# Patient Record
Sex: Male | Born: 1975 | Race: White | Hispanic: No | Marital: Married | Smoking: Never smoker
Health system: Southern US, Community
[De-identification: ages and names within clinical notes are randomized; demographics above are authoritative.]

## PROBLEM LIST (undated history)

## (undated) DIAGNOSIS — E7800 Pure hypercholesterolemia, unspecified: Secondary | ICD-10-CM

## (undated) DIAGNOSIS — I10 Essential (primary) hypertension: Secondary | ICD-10-CM

## (undated) DIAGNOSIS — E119 Type 2 diabetes mellitus without complications: Secondary | ICD-10-CM

## (undated) HISTORY — PX: HERNIA REPAIR: SHX51

---

## 2013-08-20 ENCOUNTER — Encounter (HOSPITAL_COMMUNITY): Payer: Self-pay | Admitting: Emergency Medicine

## 2013-08-20 ENCOUNTER — Emergency Department (HOSPITAL_COMMUNITY)
Admission: EM | Admit: 2013-08-20 | Discharge: 2013-08-20 | Disposition: A | Discharge status: Home / Self Care | Payer: BC Managed Care – PPO | Attending: Emergency Medicine | Admitting: Emergency Medicine

## 2013-08-20 ENCOUNTER — Emergency Department (HOSPITAL_COMMUNITY): Payer: BC Managed Care – PPO

## 2013-08-20 DIAGNOSIS — Z79899 Other long term (current) drug therapy: Secondary | ICD-10-CM | POA: Insufficient documentation

## 2013-08-20 DIAGNOSIS — E785 Hyperlipidemia, unspecified: Secondary | ICD-10-CM | POA: Insufficient documentation

## 2013-08-20 DIAGNOSIS — M47812 Spondylosis without myelopathy or radiculopathy, cervical region: Secondary | ICD-10-CM | POA: Insufficient documentation

## 2013-08-20 DIAGNOSIS — I10 Essential (primary) hypertension: Secondary | ICD-10-CM | POA: Insufficient documentation

## 2013-08-20 DIAGNOSIS — M25519 Pain in unspecified shoulder: Secondary | ICD-10-CM

## 2013-08-20 HISTORY — DX: Essential (primary) hypertension: I10

## 2013-08-20 HISTORY — DX: Pure hypercholesterolemia, unspecified: E78.00

## 2013-08-20 MED ORDER — CELECOXIB 100 MG PO CAPS: 100.0000 mg | ORAL_CAPSULE | Freq: Two times a day (BID) | ORAL | Status: DC

## 2013-08-20 MED ORDER — DEXAMETHASONE SODIUM PHOSPHATE 4 MG/ML IJ SOLN
8.0000 mg | Freq: Once | INTRAMUSCULAR | Status: AC
  Administered 2013-08-20: 8 mg via INTRAMUSCULAR
  Filled 2013-08-20: qty 2

## 2013-08-20 MED ORDER — HYDROCODONE-ACETAMINOPHEN 5-325 MG PO TABS: 1.0000 | ORAL_TABLET | ORAL | Status: DC | PRN

## 2013-08-20 MED ORDER — HYDROCODONE-ACETAMINOPHEN 5-325 MG PO TABS
2.0000 | ORAL_TABLET | Freq: Once | ORAL | Status: AC
  Administered 2013-08-20: 2 via ORAL
  Filled 2013-08-20: qty 2

## 2013-08-20 MED ORDER — KETOROLAC TROMETHAMINE 10 MG PO TABS
10.0000 mg | ORAL_TABLET | Freq: Once | ORAL | Status: AC
  Administered 2013-08-20: 10 mg via ORAL
  Filled 2013-08-20: qty 1
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
## 2013-08-20 NOTE — ED Notes (Signed)
Pt c/o neck pain that radiates down right arm x 2 weeks

## 2013-08-20 NOTE — ED Notes (Signed)
C/O lower cervical spine pain x > 1 month. Was seen by MD and given Carisprodol which has not helped.  Pain is sharp w/occasional tingling and radiates under scapula, to right shoulder and down R arm to elbow.  Muscle strength 3+ on R bicep, tricep.  States it hurts for him to raise his head upward.  Denies HAs, CP, dizziness, N/V.

## 2013-08-20 NOTE — Discharge Instructions (Signed)
Your x-rays are negative for fracture or dislocation. Your cervical spine films reveal some degenerative changes present. Please use Celebrex 2 times daily, Norco every 4 hours if needed for pain. This medication may cause drowsiness, please use with caution. Please see the orthopedic specialist listed above, or the orthopedist of your choice for additional evaluation and to complete the workup. Shoulder Pain The shoulder is the joint that connects your arms to your body. The bones that form the shoulder joint include the upper arm bone (humerus), the shoulder blade (scapula), and the collarbone (clavicle). The top of the humerus is shaped like a ball and fits into a rather flat socket on the scapula (glenoid cavity). A combination of muscles and strong, fibrous tissues that connect muscles to bones (tendons) support your shoulder joint and hold the ball in the socket. Small, fluid-filled sacs (bursae) are located in different areas of the joint. They act as cushions between the bones and the overlying soft tissues and help reduce friction between the gliding tendons and the bone as you move your arm. Your shoulder joint allows a wide range of motion in your arm. This range of motion allows you to do things like scratch your back or throw a ball. However, this range of motion also makes your shoulder more prone to pain from overuse and injury. Causes of shoulder pain can originate from both injury and overuse and usually can be grouped in the following four categories:  Redness, swelling, and pain (inflammation) of the tendon (tendinitis) or the bursae (bursitis).  Instability, such as a dislocation of the joint.  Inflammation of the joint (arthritis).  Broken bone (fracture). HOME CARE INSTRUCTIONS   Apply ice to the sore area.  Put ice in a plastic bag.  Place a towel between your skin and the bag.  Leave the ice on for 15-20 minutes, 03-04 times per day for the first 2 days.  Stop using cold  packs if they do not help with the pain.  If you have a shoulder sling or immobilizer, wear it as long as your caregiver instructs. Only remove it to shower or bathe. Move your arm as little as possible, but keep your hand moving to prevent swelling.  Squeeze a soft ball or foam pad as much as possible to help prevent swelling.  Only take over-the-counter or prescription medicines for pain, discomfort, or fever as directed by your caregiver. SEEK MEDICAL CARE IF:   Your shoulder pain increases, or new pain develops in your arm, hand, or fingers.  Your hand or fingers become cold and numb.  Your pain is not relieved with medicines. SEEK IMMEDIATE MEDICAL CARE IF:   Your arm, hand, or fingers are numb or tingling.  Your arm, hand, or fingers are significantly swollen or turn white or blue. MAKE SURE YOU:   Understand these instructions.  Will watch your condition.  Will get help right away if you are not doing well or get worse. Document Released: 12/04/2004 Document Revised: 11/19/2011 Document Reviewed: 02/08/2011 [facility] Patient Information 2014 [place].

## 2013-08-20 NOTE — ED Provider Notes (Signed)
Medical screening examination/treatment/procedure(s) were performed by non-physician practitioner and as supervising physician I was immediately available for consultation/collaboration.   EKG Interpretation None       Orlie Dakin, MD 08/20/13 1614

## 2013-08-20 NOTE — ED Notes (Signed)
Patient with no complaints at this time. Respirations even and unlabored. Skin warm/dry. Discharge instructions reviewed with patient at this time. Patient given opportunity to voice concerns/ask questions. Patient discharged at this time and left Emergency Department with steady gait.   

## 2013-09-01 ENCOUNTER — Telehealth: Payer: Self-pay | Admitting: Orthopedic Surgery

## 2013-09-01 NOTE — Telephone Encounter (Signed)
Patient called to cancel appointment and stated he was seen by another Orthopedic DR.

## 2013-09-06 ENCOUNTER — Ambulatory Visit: Payer: BC Managed Care – PPO | Admitting: Orthopedic Surgery

## 2015-05-29 IMAGING — CR DG CERVICAL SPINE COMPLETE 4+V
7 series · 7 of 7 positions shown · non-contrast
Comparison: None.

CLINICAL DATA: Right shoulder neck pain for 1 month. No history of
recent injury.

EXAM:
CERVICAL SPINE  4+ VIEWS

[view not recorded (1 of 7)]
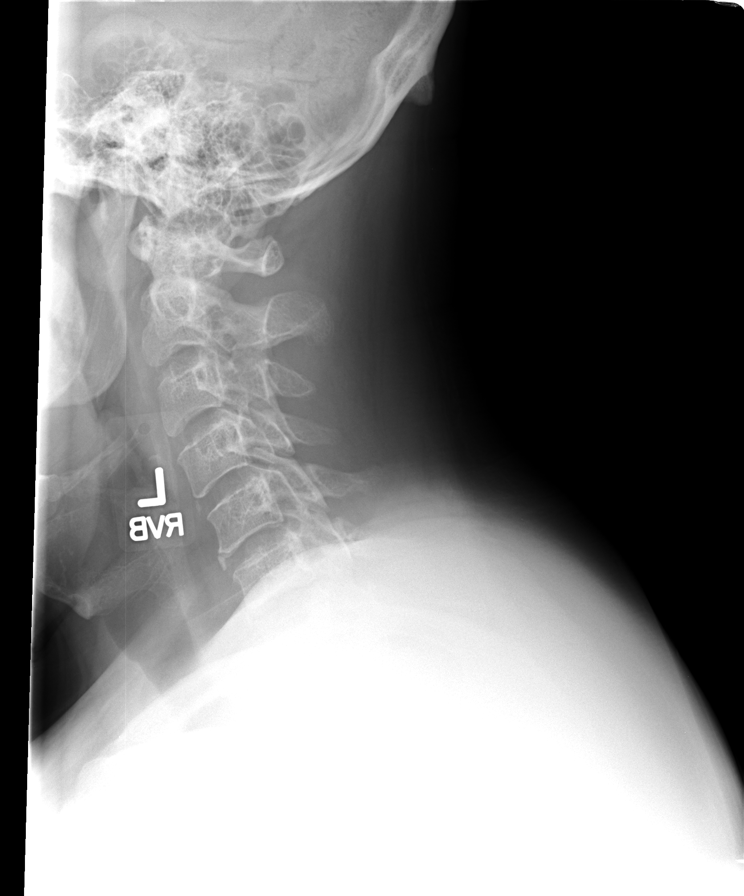

[view not recorded (2 of 7)]
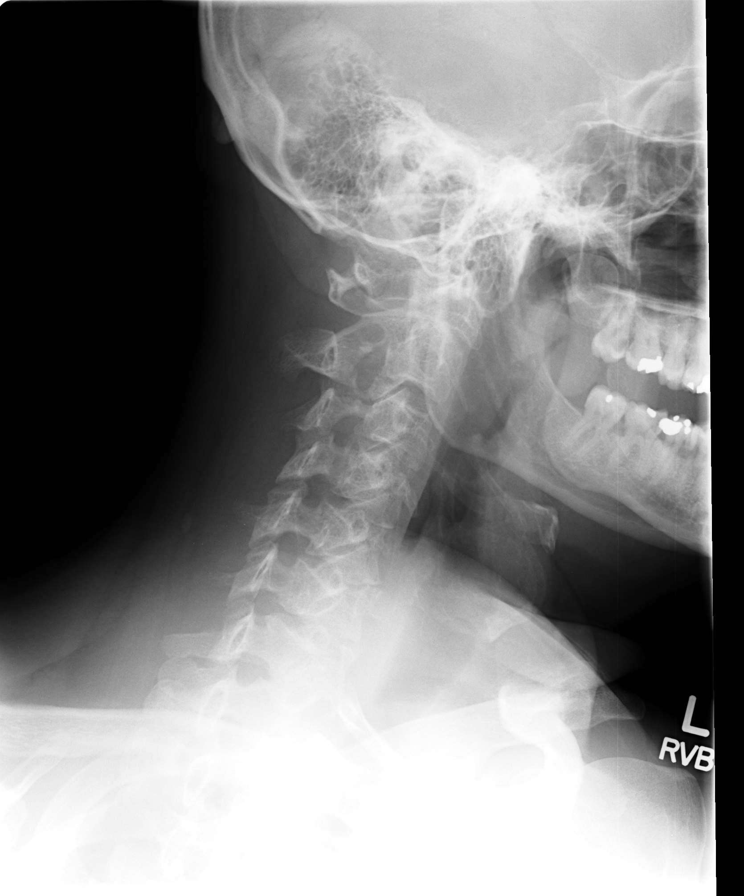

[view not recorded (3 of 7)]
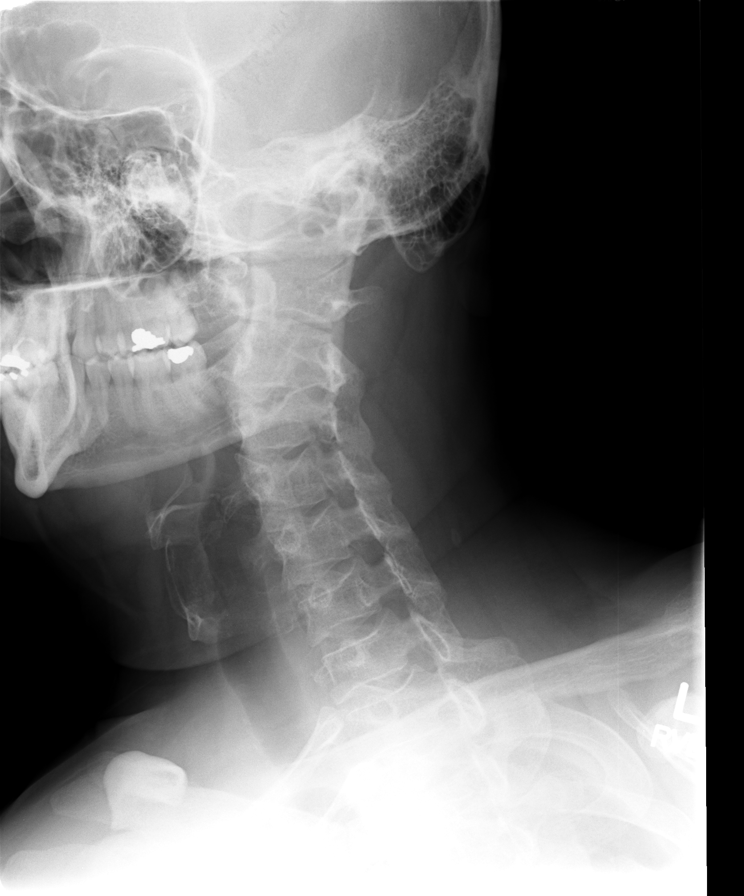

[view not recorded (4 of 7)]
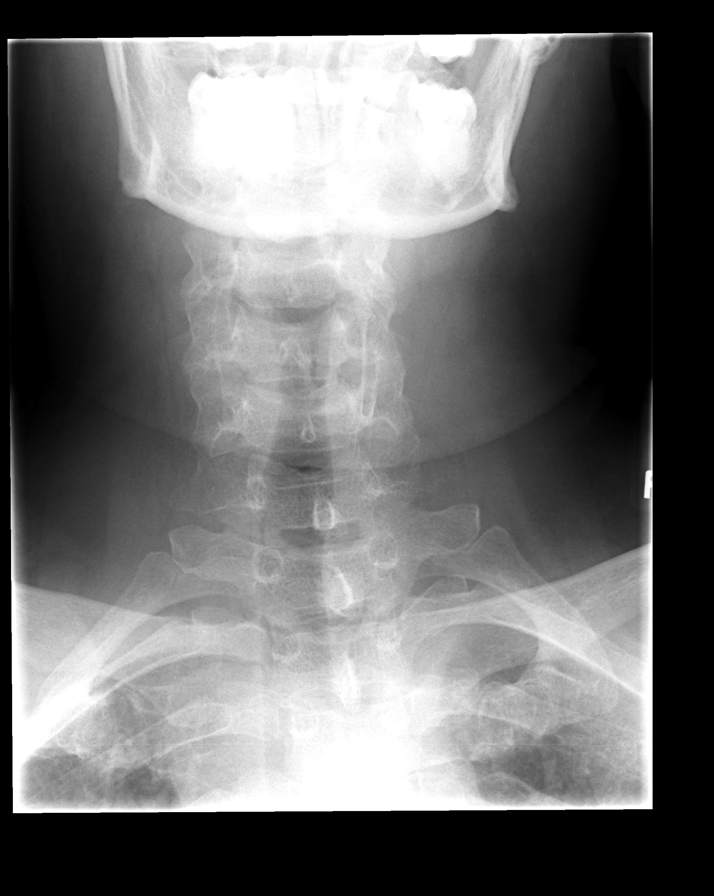

[view not recorded (5 of 7)]
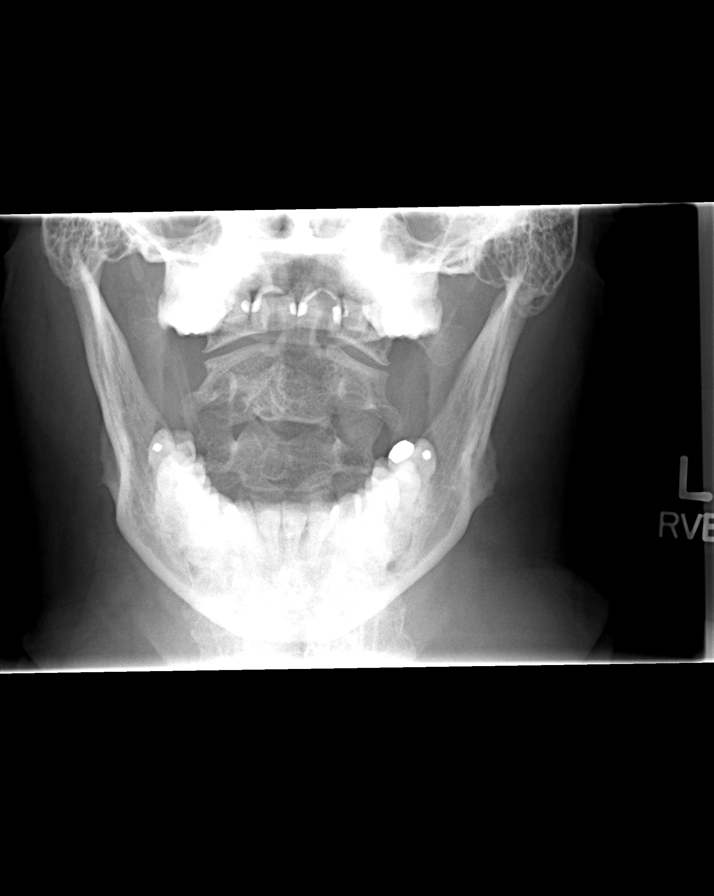

[view not recorded (6 of 7)]
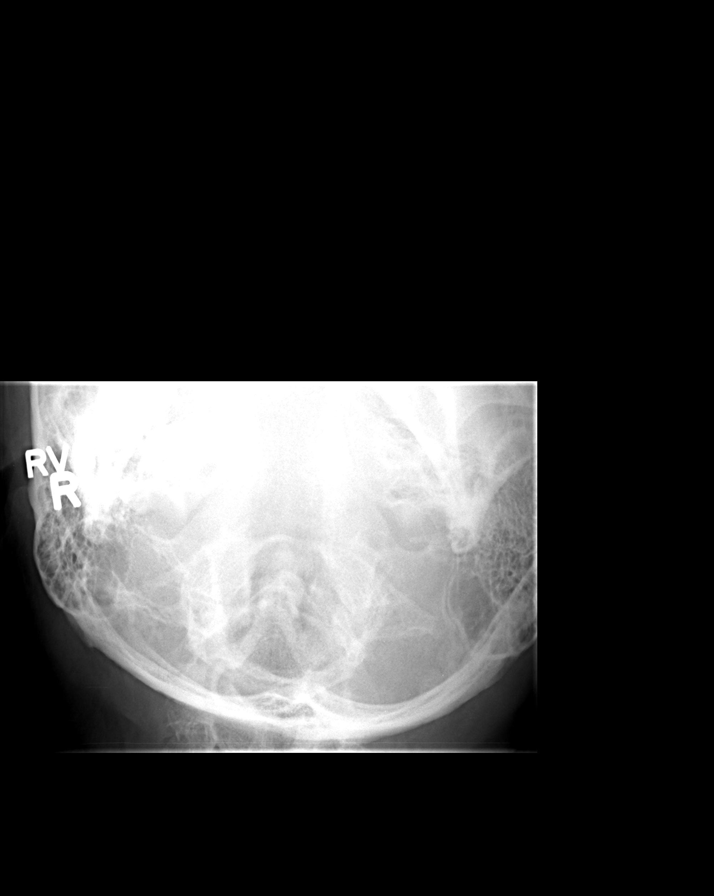

[view not recorded (7 of 7)]
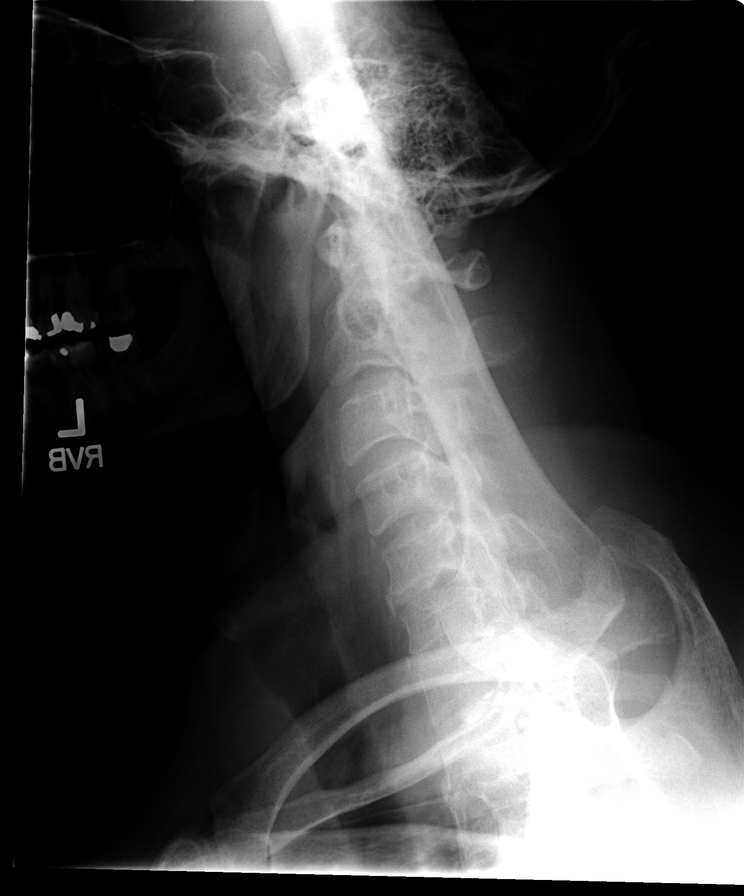

[7 of 7 positions shown; findings below may reference images not displayed]

FINDINGS: There is no evidence of cervical spine fracture or prevertebral soft
tissue swelling. No subluxation. There is mild spondylotic
ossification in the anterior mid and lower cervical region, without
significant disc narrowing. No osseous foraminal stenosis to explain
shoulder pain.
IMPRESSION: No acute osseous abnormality or notable degenerative change.

## 2017-05-19 ENCOUNTER — Encounter: Payer: Self-pay | Admitting: Orthopedic Surgery

## 2017-05-19 ENCOUNTER — Ambulatory Visit: Payer: BC Managed Care – PPO | Admitting: Orthopedic Surgery

## 2017-05-19 ENCOUNTER — Ambulatory Visit (INDEPENDENT_AMBULATORY_CARE_PROVIDER_SITE_OTHER): Payer: BC Managed Care – PPO

## 2017-05-19 VITALS — BP 129/87 | HR 92 | Ht 67.0 in | Wt 239.0 lb

## 2017-05-19 DIAGNOSIS — M542 Cervicalgia: Secondary | ICD-10-CM | POA: Diagnosis not present

## 2017-05-19 DIAGNOSIS — M75101 Unspecified rotator cuff tear or rupture of right shoulder, not specified as traumatic: Secondary | ICD-10-CM

## 2017-05-19 NOTE — Progress Notes (Signed)
  NEW PATIENT OFFICE VISIT   Chief Complaint  Patient presents with  . Neck Pain    Neck pain, no injury    42 year old male presents for evaluation of neck and shoulder pain  He gives a history of prior shoulder neck pain relieved with subacromial injection.  On this occasion he was doing a lot of repetitive lifting removing some laminate flooring and started having pain in the neck radiating down into the shoulder up to the elbow and not below  He says it feels very similar  His current symptoms are dull aching shoulder and neck pain right shoulder midline cervical spine radiates to the elbow present for 2-3 weeks associated with no hand symptoms and no loss of motion in the shoulder.    Review of Systems  Constitutional: Negative for chills, fever, malaise/fatigue and weight loss.  Musculoskeletal: Positive for joint pain and neck pain.  Neurological: Negative for tingling, sensory change and focal weakness.     Past Medical History:  Diagnosis Date  . Hypercholesteremia   . Hypertension     No past surgical history on file.  No family history on file. Social History   Tobacco Use  . Smoking status: Never Smoker  Substance Use Topics  . Alcohol use: No  . Drug use: No   No Known Allergies   No outpatient medications have been marked as taking for the 05/19/17 encounter (Office Visit) with Carole Civil, MD.    BP 129/87   Pulse 92   Ht 5\' 7"  (1.702 m)   Wt 239 lb (108.4 kg)   BMI 37.43 kg/m   Physical Exam  Ortho Exam  MEDICAL DECISION SECTION  xrays ordered? yes  My independent reading of xrays: Our x-rays today were done of the neck he has mid cervical spine spondylosis moderate to severe loss of lordosis   Encounter Diagnosis  Name Primary?  . Neck pain without injury Yes     PLAN:    Injection? Yes  Procedure note the subacromial injection shoulder RIGHT  Verbal consent was obtained to inject the  RIGHT   Shoulder  Timeout  was completed to confirm the injection site is a subacromial space of the  RIGHT  shoulder   Medication used Depo-Medrol 40 mg and lidocaine 1% 3 cc  Anesthesia was provided by ethyl chloride  The injection was performed in the RIGHT  posterior subacromial space. After pinning the skin with alcohol and anesthetized the skin with ethyl chloride the subacromial space was injected using a 20-gauge needle. There were no complications  Sterile dressing was applied.   Recommend heating pad Insert over-the-counter ibuprofen or Aleve If this does not take care of it then we should pursue his neck as a source of pain with further imaging

## 2017-05-19 NOTE — Patient Instructions (Signed)
Recommend heating pad Insert over-the-counter ibuprofen or Aleve If there is no improvement after 2 weeks please call us back so that we can reevaluate you with further office visit and CT or MRI

## 2018-04-28 ENCOUNTER — Ambulatory Visit: Payer: BC Managed Care – PPO | Admitting: Urology

## 2018-04-28 DIAGNOSIS — E291 Testicular hypofunction: Secondary | ICD-10-CM | POA: Diagnosis not present

## 2018-07-12 ENCOUNTER — Encounter (HOSPITAL_COMMUNITY): Payer: Self-pay | Admitting: Emergency Medicine

## 2018-07-12 ENCOUNTER — Other Ambulatory Visit: Payer: Self-pay

## 2018-07-12 ENCOUNTER — Emergency Department (HOSPITAL_COMMUNITY)
Admission: EM | Admit: 2018-07-12 | Discharge: 2018-07-12 | Disposition: A | Discharge status: Home / Self Care | Payer: BC Managed Care – PPO | Attending: Emergency Medicine | Admitting: Emergency Medicine

## 2018-07-12 DIAGNOSIS — Y999 Unspecified external cause status: Secondary | ICD-10-CM | POA: Insufficient documentation

## 2018-07-12 DIAGNOSIS — Z23 Encounter for immunization: Secondary | ICD-10-CM | POA: Diagnosis not present

## 2018-07-12 DIAGNOSIS — Y9285 Railroad track as the place of occurrence of the external cause: Secondary | ICD-10-CM | POA: Insufficient documentation

## 2018-07-12 DIAGNOSIS — Z79899 Other long term (current) drug therapy: Secondary | ICD-10-CM | POA: Insufficient documentation

## 2018-07-12 DIAGNOSIS — Y9389 Activity, other specified: Secondary | ICD-10-CM | POA: Insufficient documentation

## 2018-07-12 DIAGNOSIS — I10 Essential (primary) hypertension: Secondary | ICD-10-CM | POA: Insufficient documentation

## 2018-07-12 DIAGNOSIS — S71151A Open bite, right thigh, initial encounter: Secondary | ICD-10-CM | POA: Diagnosis not present

## 2018-07-12 DIAGNOSIS — W5911XA Bitten by nonvenomous snake, initial encounter: Secondary | ICD-10-CM | POA: Insufficient documentation

## 2018-07-12 MED ORDER — TETANUS-DIPHTH-ACELL PERTUSSIS 5-2.5-18.5 LF-MCG/0.5 IM SUSP
0.5000 mL | Freq: Once | INTRAMUSCULAR | Status: AC
  Administered 2018-07-12: 0.5 mL via INTRAMUSCULAR
  Filled 2018-07-12: qty 0.5

## 2018-07-12 MED ORDER — OXYCODONE-ACETAMINOPHEN 5-325 MG PO TABS
1.0000 | ORAL_TABLET | Freq: Once | ORAL | Status: AC
  Administered 2018-07-12: 1 via ORAL
  Filled 2018-07-12: qty 1

## 2018-07-12 NOTE — ED Provider Notes (Signed)
[facility] EMERGENCY DEPARTMENT Provider Note   CSN: [id number] Arrival date & time: 07/12/18  1248    History   Chief Complaint Chief Complaint  Patient presents with  . Animal Bite    HPI Derek Hines is a 43 y.o. male.     HPI   Derek Hines is a 43 y.o. male who presents to the Emergency Department complaining of snake bite to right thigh.  Incident occurred 30 minutes prior to arrival.  States that he stepped over two railroad ties and a black snake was lying between them and bit him on the leg.  No prior therapies, complains of a burning sensation at the site.  Denies swelling or numbness of the extremity, redness, nausea or vomiting.  Last Td is unknown.   Past Medical History:  Diagnosis Date  . Hypercholesteremia   . Hypertension     There are no active problems to display for this patient.   History reviewed. No pertinent surgical history.      Home Medications    Prior to Admission medications   Medication Sig Start Date End Date Taking? Authorizing Provider  carisoprodol (SOMA) 350 MG tablet Take 350 mg by mouth 3 (three) times daily as needed for muscle spasms.    [provider]  celecoxib (CELEBREX) 100 MG capsule Take 1 capsule (100 mg total) by mouth 2 (two) times daily. 08/20/13   Lily Kocher, PA-C  Fexofenadine HCl (ALLEGRA PO) Take 1 tablet by mouth daily.    [provider]  HYDROcodone-acetaminophen (NORCO/VICODIN) 5-325 MG per tablet Take 1 tablet by mouth every 4 (four) hours as needed for moderate pain. 08/20/13   Lily Kocher, PA-C  ibuprofen (ADVIL,MOTRIN) 200 MG tablet Take 800 mg by mouth every 6 (six) hours as needed for mild pain.    [provider]  lisinopril (PRINIVIL,ZESTRIL) 40 MG tablet Take 40 mg by mouth daily.    [provider]  pravastatin (PRAVACHOL) 10 MG tablet Take 10 mg by mouth at bedtime.    [provider]  triamcinolone (NASACORT ALLERGY 24HR) 55 MCG/ACT AERO  nasal inhaler Place 1 spray into the nose daily.    [provider]    Family History History reviewed. No pertinent family history.  Social History Social History   Tobacco Use  . Smoking status: Never Smoker  . Smokeless tobacco: Never Used  Substance Use Topics  . Alcohol use: No  . Drug use: No     Allergies   Patient has no known allergies.   Review of Systems Review of Systems  Constitutional: Negative for chills and fever.  Musculoskeletal: Positive for myalgias. Negative for arthralgias and joint swelling.  Skin: Positive for wound.       Snake bite right thigh  Neurological: Negative for dizziness, weakness and numbness.  Hematological: Does not bruise/bleed easily.     Physical Exam Updated Vital Signs BP (!) 141/121   Pulse (!) 108   Temp 98.1 F (36.7 C)   Resp 13   Ht 5\' 6"  (1.676 m)   Wt 107 kg   SpO2 100%   BMI 38.09 kg/m   Physical Exam Vitals signs and nursing note reviewed.  Constitutional:      General: He is not in acute distress.    Appearance: Normal appearance. He is well-developed.  HENT:     Head: Atraumatic.  Cardiovascular:     Rate and Rhythm: Normal rate and regular rhythm.     Pulses: Normal pulses.  Heart sounds: No murmur.  Pulmonary:     Effort: Pulmonary effort is normal. No respiratory distress.     Breath sounds: Normal breath sounds.  Musculoskeletal: Normal range of motion.        General: Tenderness present. No swelling.  Skin:    General: Skin is warm and dry.     Capillary Refill: Capillary refill takes less than 2 seconds.     Comments: Pin point puncture wounds to the medial right thigh.  No surrounding erythema, edema or induration.    Neurological:     General: No focal deficit present.     Mental Status: He is alert.     Sensory: No sensory deficit.     Motor: No abnormal muscle tone.     Coordination: Coordination normal.      Pt gave verbal consent for digital image to be placed in  medical record.  Understands that no imagines will be saved to any portable electronic devices.    ED Treatments / Results  Labs (all labs ordered are listed, but only abnormal results are displayed) Labs Reviewed - No data to display  EKG None  Radiology No results found.  Procedures Procedures (including critical care time)  Medications Ordered in ED Medications - No data to display   Initial Impression / Assessment and Plan / ED Course  I have reviewed the triage vital signs and the nursing notes.  Pertinent labs & imaging results that were available during my care of the patient were reviewed by me and considered in my medical decision making (see chart for details).        Pt with reported snake bite to right medial thigh.  No surrounding erythema or edema.  Wound borders marked by nursing upon arrival.  NV intact.  Pt reports bite was by a black snake.  Will clean, up date Td and observe.    1400  On recheck, pt is resting comfortably.  Pain improving.  Wounds cleaned, Td updated.  Remains NV intact, no complications.    1450  Symptoms improved, ambulating w/o pain or difficulty.  NV intact.  No developing erythema or edema.  Pt appears appropriate for d/c home, agrees to close observation and return precautions discussed.   Final Clinical Impressions(s) / ED Diagnoses   Final diagnoses:  Snake bite, initial encounter    ED Discharge Orders    None       Bufford Lope 07/12/18 1456    Virgel Manifold, MD 07/13/18 0700

## 2018-07-12 NOTE — Discharge Instructions (Addendum)
Keep the area clean with mild soap and water.  Watch for surrounding redness, swelling or increasing pain.  Ibuprofen if needed for pain.  Return here for any worsening symptoms

## 2018-07-12 NOTE — ED Triage Notes (Signed)
Pt was bit on the left thigh by a black garden snake.  Burning sensation present. Pt very anxious

## 2018-09-22 ENCOUNTER — Ambulatory Visit: Payer: BC Managed Care – PPO | Admitting: Urology

## 2019-10-13 ENCOUNTER — Ambulatory Visit (INDEPENDENT_AMBULATORY_CARE_PROVIDER_SITE_OTHER): Payer: BC Managed Care – PPO | Admitting: Urology

## 2019-10-13 ENCOUNTER — Other Ambulatory Visit: Payer: Self-pay

## 2019-10-13 ENCOUNTER — Encounter: Payer: Self-pay | Admitting: Urology

## 2019-10-13 VITALS — BP 113/74 | HR 111 | Temp 99.0°F | Ht 66.0 in | Wt 224.0 lb

## 2019-10-13 DIAGNOSIS — E291 Testicular hypofunction: Secondary | ICD-10-CM | POA: Insufficient documentation

## 2019-10-13 LAB — URINALYSIS, ROUTINE W REFLEX MICROSCOPIC
Bilirubin, UA: NEGATIVE
Ketones, UA: NEGATIVE
Leukocytes,UA: NEGATIVE
Nitrite, UA: NEGATIVE
Protein,UA: NEGATIVE
RBC, UA: NEGATIVE
Specific Gravity, UA: 1.02 (ref 1.005–1.030)
Urobilinogen, Ur: 0.2 mg/dL (ref 0.2–1.0)
pH, UA: 5 (ref 5.0–7.5)

## 2019-10-13 NOTE — Progress Notes (Signed)
10/13/2019 9:19 AM   Derek Hines [birth year] [id number]  Referring provider: Renee Rival, NP [address] [place],  [place] [address]  Hypogonadism  HPI: Mr Derek Hines is a 44yo here for followup for hypogonadism. He has not been seen since 04/2018. Testosterone at that time was 306. He continue to have fatigue, fair libido, issues maintaining an erection.    PMH: Past Medical History:  Diagnosis Date  . Hypercholesteremia   . Hypertension     Surgical History: No past surgical history on file.  Home Medications:  Allergies as of 10/13/2019   No Known Allergies     Medication List       Accurate as of October 13, 2019  9:19 AM. If you have any questions, ask your nurse or doctor.        ALLEGRA PO Take 1 tablet by mouth daily.   buPROPion 300 MG 24 hr tablet Commonly known as: WELLBUTRIN XL Take 300 mg by mouth at bedtime.   carisoprodol 350 MG tablet Commonly known as: SOMA Take 350 mg by mouth 3 (three) times daily as needed for muscle spasms.   celecoxib 100 MG capsule Commonly known as: CeleBREX Take 1 capsule (100 mg total) by mouth 2 (two) times daily.   DULoxetine 30 MG capsule Commonly known as: CYMBALTA Take 30 mg by mouth daily.   Fish Oil 1000 MG Caps Take by mouth.   hydrochlorothiazide 12.5 MG tablet Commonly known as: HYDRODIURIL Take 12.5 mg by mouth daily.   HYDROcodone-acetaminophen 5-325 MG tablet Commonly known as: NORCO/VICODIN Take 1 tablet by mouth every 4 (four) hours as needed for moderate pain.   ibuprofen 200 MG tablet Commonly known as: ADVIL Take 800 mg by mouth every 6 (six) hours as needed for mild pain.   lisinopril 40 MG tablet Commonly known as: ZESTRIL Take 40 mg by mouth daily.   metFORMIN 500 MG tablet Commonly known as: GLUCOPHAGE Take by mouth 2 (two) times daily with a meal.   Nasacort Allergy 24HR 55 MCG/ACT Aero nasal inhaler Generic drug: triamcinolone Place 1 spray into the nose daily.     pravastatin 10 MG tablet Commonly known as: PRAVACHOL Take 10 mg by mouth at bedtime.       Allergies: No Known Allergies  Family History: No family history on file.  Social History:  reports that he has never smoked. He has never used smokeless tobacco. He reports that he does not drink alcohol and does not use drugs.  ROS: All other review of systems were reviewed and are negative except what is noted above in HPI  Physical Exam: BP 113/74   Pulse (!) 111   Temp 99 F (37.2 C)   Ht 5\' 6"  (1.676 m)   Wt 224 lb (101.6 kg)   BMI 36.15 kg/m   Constitutional:  Alert and oriented, No acute distress. HEENT: [place] AT, moist mucus membranes.  Trachea midline, no masses. Cardiovascular: No clubbing, cyanosis, or edema. Respiratory: Normal respiratory effort, no increased work of breathing. GI: Abdomen is soft, nontender, nondistended, no abdominal masses GU: No CVA tenderness.  Lymph: No cervical or inguinal lymphadenopathy. Skin: No rashes, bruises or suspicious lesions. Neurologic: Grossly intact, no focal deficits, moving all 4 extremities. Psychiatric: Normal mood and affect.  Laboratory Data: No results found for: WBC, HGB, HCT, MCV, PLT  No results found for: CREATININE  No results found for: PSA  No results found for: TESTOSTERONE  No results found for: HGBA1C  Urinalysis  Component Value Date/Time   APPEARANCEUR Clear 10/13/2019 0858   GLUCOSEU 1+ (A) 10/13/2019 0858   BILIRUBINUR Negative 10/13/2019 0858   PROTEINUR Negative 10/13/2019 0858   NITRITE Negative 10/13/2019 0858   LEUKOCYTESUR Negative 10/13/2019 0858    Lab Results  Component Value Date   LABMICR Comment 10/13/2019    Pertinent Imaging:  No results found for this or any previous visit.  No results found for this or any previous visit.  No results found for this or any previous visit.  No results found for this or any previous visit.  No results found for this or any previous  visit.  No results found for this or any previous visit.  No results found for this or any previous visit.  No results found for this or any previous visit.   Assessment & Plan:    1. Hypogonadism in male -Testosterone labs today -We will start IM testosterone based on labs -RTC 3 months with labs - Urinalysis, Routine w reflex microscopic   No follow-ups on file.  Nicolette Bang, MD  [facility] [place]

## 2019-10-13 NOTE — Progress Notes (Signed)
Urological Symptom Review  Patient is experiencing the following symptoms: Get up at night to urinate Leakage of urine Erection problems (male only)  Kidney stones   Review of Systems  Gastrointestinal (upper)  : Negative for upper GI symptoms  Gastrointestinal (lower) : Constipation  Constitutional : Negative for symptoms  Skin: Negative for skin symptoms  Eyes: Negative for eye symptoms  Ear/Nose/Throat : Negative for Ear/Nose/Throat symptoms  Hematologic/Lymphatic: Negative for Hematologic/Lymphatic symptoms  Cardiovascular : Negative for cardiovascular symptoms  Respiratory : Negative for respiratory symptoms  Endocrine: Negative for endocrine symptoms  Musculoskeletal: Negative for musculoskeletal symptoms  Neurological: Negative for neurological symptoms  Psychologic: Anxiety

## 2019-10-13 NOTE — Patient Instructions (Signed)
Testosterone injection What is this medicine? TESTOSTERONE (tes TOS ter one) is the main male hormone. It supports normal male development such as muscle growth, facial hair, and deep voice. It is used in males to treat low testosterone levels. This medicine may be used for other purposes; ask your health care provider or pharmacist if you have questions. COMMON BRAND NAME(S): Andro-L.A., Aveed, Delatestryl, Depo-Testosterone, Virilon What should I tell my health care provider before I take this medicine? They need to know if you have any of these conditions:  cancer  diabetes  heart disease  kidney disease  liver disease  lung disease  prostate disease  an unusual or allergic reaction to testosterone, other medicines, foods, dyes, or preservatives  pregnant or trying to get pregnant  breast-feeding How should I use this medicine? This medicine is for injection into a muscle. It is usually given by a health care professional in a hospital or clinic setting. Contact your pediatrician regarding the use of this medicine in children. While this medicine may be prescribed for children as young as 54 years of age for selected conditions, precautions do apply. Overdosage: If you think you have taken too much of this medicine contact a poison control center or emergency room at once. NOTE: This medicine is only for you. Do not share this medicine with others. What if I miss a dose? Try not to miss a dose. Your doctor or health care professional will tell you when your next injection is due. Notify the office if you are unable to keep an appointment. What may interact with this medicine?  medicines for diabetes  medicines that treat or prevent blood clots like warfarin  oxyphenbutazone  propranolol  steroid medicines like prednisone or cortisone This list may not describe all possible interactions. Give your health care provider a list of all the medicines, herbs, non-prescription  drugs, or dietary supplements you use. Also tell them if you smoke, drink alcohol, or use illegal drugs. Some items may interact with your medicine. What should I watch for while using this medicine? Visit your doctor or health care professional for regular checks on your progress. They will need to check the level of testosterone in your blood. This medicine is only approved for use in men who have low levels of testosterone related to certain medical conditions. Heart attacks and strokes have been reported with the use of this medicine. Notify your doctor or health care professional and seek emergency treatment if you develop breathing problems; changes in vision; confusion; chest pain or chest tightness; sudden arm pain; severe, sudden headache; trouble speaking or understanding; sudden numbness or weakness of the face, arm or leg; loss of balance or coordination. Talk to your doctor about the risks and benefits of this medicine. This medicine may affect blood sugar levels. If you have diabetes, check with your doctor or health care professional before you change your diet or the dose of your diabetic medicine. Testosterone injections are not commonly used in women. Women should inform their doctor if they wish to become pregnant or think they might be pregnant. There is a potential for serious side effects to an unborn child. Talk to your health care professional or pharmacist for more information. Talk with your doctor or health care professional about your birth control options while taking this medicine. This drug is banned from use in athletes by most athletic organizations. What side effects may I notice from receiving this medicine? Side effects that you should report to  your doctor or health care professional as soon as possible:  allergic reactions like skin rash, itching or hives, swelling of the face, lips, or tongue  breast enlargement  breathing problems  changes in emotions or  moods  deep or hoarse voice  irregular menstrual periods  signs and symptoms of liver injury like dark yellow or brown urine; general ill feeling or flu-like symptoms; light-colored stools; loss of appetite; nausea; right upper belly pain; unusually weak or tired; yellowing of the eyes or skin  stomach pain  swelling of the ankles, feet, hands  too frequent or persistent erections  trouble passing urine or change in the amount of urine Side effects that usually do not require medical attention (report to your doctor or health care professional if they continue or are bothersome):  acne  change in sex drive or performance  facial hair growth  hair loss  headache This list may not describe all possible side effects. Call your doctor for medical advice about side effects. You may report side effects to FDA at 1-800-FDA-1088. Where should I keep my medicine? Keep out of the reach of children. This medicine can be abused. Keep your medicine in a safe place to protect it from theft. Do not share this medicine with anyone. Selling or giving away this medicine is dangerous and against the law. Store at room temperature between 20 and 25 degrees C (68 and 77 degrees F). Do not freeze. Protect from light. Follow the directions for the product you are prescribed. Throw away any unused medicine after the expiration date. NOTE: This sheet is a summary. It may not cover all possible information. If you have questions about this medicine, talk to your doctor, pharmacist, or health care provider.  2020 Elsevier/Gold Standard (2015-03-31 07:33:55)  

## 2019-10-17 LAB — CBC WITH DIFFERENTIAL
Basophils Absolute: 0.1 10*3/uL (ref 0.0–0.2)
Basos: 1 %
EOS (ABSOLUTE): 0.1 10*3/uL (ref 0.0–0.4)
Eos: 2 %
Hematocrit: 54 % — ABNORMAL HIGH (ref 37.5–51.0)
Hemoglobin: 18.2 g/dL — ABNORMAL HIGH (ref 13.0–17.7)
Immature Grans (Abs): 0 10*3/uL (ref 0.0–0.1)
Immature Granulocytes: 1 %
Lymphocytes Absolute: 2.3 10*3/uL (ref 0.7–3.1)
Lymphs: 27 %
MCH: 29.9 pg (ref 26.6–33.0)
MCHC: 33.7 g/dL (ref 31.5–35.7)
MCV: 89 fL (ref 79–97)
Monocytes Absolute: 0.5 10*3/uL (ref 0.1–0.9)
Monocytes: 6 %
Neutrophils Absolute: 5.3 10*3/uL (ref 1.4–7.0)
Neutrophils: 63 %
RBC: 6.08 x10E6/uL — ABNORMAL HIGH (ref 4.14–5.80)
RDW: 12.4 % (ref 11.6–15.4)
WBC: 8.3 10*3/uL (ref 3.4–10.8)

## 2019-10-17 LAB — COMPREHENSIVE METABOLIC PANEL
ALT: 43 IU/L (ref 0–44)
AST: 30 IU/L (ref 0–40)
Albumin/Globulin Ratio: 1.8 (ref 1.2–2.2)
Albumin: 4.4 g/dL (ref 4.0–5.0)
Alkaline Phosphatase: 94 IU/L (ref 48–121)
BUN/Creatinine Ratio: 9 (ref 9–20)
BUN: 10 mg/dL (ref 6–24)
Bilirubin Total: 0.5 mg/dL (ref 0.0–1.2)
CO2: 25 mmol/L (ref 20–29)
Calcium: 9.6 mg/dL (ref 8.7–10.2)
Chloride: 99 mmol/L (ref 96–106)
Creatinine, Ser: 1.11 mg/dL (ref 0.76–1.27)
GFR calc Af Amer: 92 mL/min/{1.73_m2} (ref >59)
GFR calc non Af Amer: 80 mL/min/{1.73_m2} (ref >59)
Globulin, Total: 2.5 g/dL (ref 1.5–4.5)
Glucose: 146 mg/dL — ABNORMAL HIGH (ref 65–99)
Potassium: 4.3 mmol/L (ref 3.5–5.2)
Sodium: 138 mmol/L (ref 134–144)
Total Protein: 6.9 g/dL (ref 6.0–8.5)

## 2019-10-17 LAB — TESTOSTERONE,FREE AND TOTAL
Testosterone, Free: 13.4 pg/mL (ref 6.8–21.5)
Testosterone: 623 ng/dL (ref 264–916)

## 2019-10-18 ENCOUNTER — Telehealth: Payer: Self-pay

## 2019-10-18 NOTE — Telephone Encounter (Signed)
-----   Message from Cleon Gustin, MD sent at 10/18/2019  9:00 AM EDT ----- Hemoglobin is high. Patient needs to donate blood ----- Message ----- From: Iris Pert, LPN Sent: [phone]/05/8880   3:35 PM EDT To: Cleon Gustin, MD  Please review

## 2019-10-18 NOTE — Telephone Encounter (Signed)
Pt made aware

## 2020-01-05 ENCOUNTER — Other Ambulatory Visit: Payer: Self-pay

## 2020-01-05 DIAGNOSIS — E291 Testicular hypofunction: Secondary | ICD-10-CM

## 2020-01-09 ENCOUNTER — Other Ambulatory Visit: Payer: BC Managed Care – PPO

## 2020-01-10 ENCOUNTER — Other Ambulatory Visit: Payer: BC Managed Care – PPO

## 2020-01-16 ENCOUNTER — Ambulatory Visit: Payer: BC Managed Care – PPO | Admitting: Urology

## 2020-06-17 ENCOUNTER — Other Ambulatory Visit: Payer: Self-pay

## 2020-06-17 ENCOUNTER — Emergency Department (HOSPITAL_COMMUNITY)
Admission: EM | Admit: 2020-06-17 | Discharge: 2020-06-17 | Disposition: A | Discharge status: Home / Self Care | Payer: BC Managed Care – PPO

## 2020-11-06 ENCOUNTER — Encounter: Payer: Self-pay | Admitting: *Deleted

## 2020-11-22 ENCOUNTER — Ambulatory Visit: Payer: BC Managed Care – PPO

## 2020-12-17 ENCOUNTER — Other Ambulatory Visit: Payer: Self-pay

## 2020-12-17 ENCOUNTER — Encounter: Payer: Self-pay | Admitting: *Deleted

## 2020-12-17 ENCOUNTER — Ambulatory Visit (INDEPENDENT_AMBULATORY_CARE_PROVIDER_SITE_OTHER): Payer: Self-pay | Admitting: *Deleted

## 2020-12-17 VITALS — Ht 66.0 in | Wt 209.4 lb

## 2020-12-17 DIAGNOSIS — Z1211 Encounter for screening for malignant neoplasm of colon: Secondary | ICD-10-CM

## 2020-12-17 MED ORDER — CLENPIQ 10-3.5-12 MG-GM -GM/160ML PO SOLN: 1.0000 | Freq: Once | ORAL | 0 refills | Status: AC

## 2020-12-17 NOTE — Progress Notes (Signed)
Gastroenterology Pre-Procedure Review  Request Date: 12/17/2020 Requesting Physician: Angelina Ok, FNP-C @ [facility], no previous TCS, family hx of polyps (father)  PATIENT REVIEW QUESTIONS: The patient responded to the following health history questions as indicated:    1. Diabetes Melitis: yes, pre-diabetic 2. Joint replacements in the past 12 months: no 3. Major health problems in the past 3 months: no 4. Has an artificial valve or MVP: no 5. Has a defibrillator: no 6. Has been advised in past to take antibiotics in advance of a procedure like teeth cleaning: no 7. Family history of colon cancer: no, but father had polyps (age 59's)  51. Alcohol Use: no 9. Illicit drug Use: no 10. History of sleep apnea: no  11. History of coronary artery or other vascular stents placed within the last 12 months: no 12. History of any prior anesthesia complications: no 13. Body mass index is 33.8 kg/m.    MEDICATIONS & ALLERGIES:    Patient reports the following regarding taking any blood thinners:   Plavix? no Aspirin? no Coumadin? no Brilinta? no Xarelto? no Eliquis? no Pradaxa? no Savaysa? no Effient? no  Patient confirms/reports the following medications:  Current Outpatient Medications  Medication Sig Dispense Refill   buPROPion (WELLBUTRIN XL) 300 MG 24 hr tablet Take 300 mg by mouth daily.     DULoxetine (CYMBALTA) 30 MG capsule Take 30 mg by mouth daily.     Fexofenadine HCl (ALLEGRA PO) Take 1 tablet by mouth daily.     hydrochlorothiazide (HYDRODIURIL) 12.5 MG tablet Take 12.5 mg by mouth daily.     lisinopril (PRINIVIL,ZESTRIL) 40 MG tablet Take 40 mg by mouth daily.     metFORMIN (GLUCOPHAGE) 500 MG tablet Take by mouth 2 (two) times daily with a meal.     montelukast (SINGULAIR) 10 MG tablet Take 10 mg by mouth at bedtime.     Omega-3 Fatty Acids (FISH OIL) 1000 MG CAPS Take by mouth daily.     OZEMPIC, 0.25 OR 0.5 MG/DOSE, 2 MG/1.5ML SOPN Inject 0.5 mg  into the skin once a week.     pantoprazole (PROTONIX) 40 MG tablet Take 40 mg by mouth daily.     pravastatin (PRAVACHOL) 10 MG tablet Take 10 mg by mouth at bedtime.     triamcinolone (NASACORT) 55 MCG/ACT AERO nasal inhaler Place 1 spray into the nose as needed.     No current facility-administered medications for this visit.    Patient confirms/reports the following allergies:  No Known Allergies  No orders of the defined types were placed in this encounter.   AUTHORIZATION INFORMATION Primary Insurance: [facility],  [place] #: [id number],  Group #: [id number] Pre-Cert / Josem Kaufmann required: No, not required  SCHEDULE INFORMATION: Procedure has been scheduled as follows:  Date: 01/01/2021, Time: 1:00  Location: [facility] with Dr. Abbey Chatters  This Gastroenterology Pre-Precedure Review Form is being routed to the following provider(s):  Neil Crouch, PA-C

## 2020-12-17 NOTE — Progress Notes (Addendum)
OK to schedule, propofol. ASA II.  Day of prep: take metformin AM dose only AM of TCS: hold metformin.  Hold Ozempic 7 days before colonoscopy.

## 2020-12-18 ENCOUNTER — Encounter: Payer: Self-pay | Admitting: *Deleted

## 2020-12-18 ENCOUNTER — Other Ambulatory Visit: Payer: Self-pay | Admitting: *Deleted

## 2020-12-18 DIAGNOSIS — Z1211 Encounter for screening for malignant neoplasm of colon: Secondary | ICD-10-CM

## 2020-12-18 NOTE — Progress Notes (Signed)
Mailed letter to pt with diabetes medication adjustments.   

## 2020-12-18 NOTE — Progress Notes (Signed)
Called [employer] and spoke to Clementon.  She informed me that colonoscopies are covered starting at age 46.  Ref#: [id number]

## 2020-12-28 ENCOUNTER — Other Ambulatory Visit (HOSPITAL_COMMUNITY)
Admission: RE | Admit: 2020-12-28 | Discharge: 2020-12-28 | Disposition: A | Discharge status: Home / Self Care | Payer: BC Managed Care – PPO | Source: Ambulatory Visit | Attending: Internal Medicine | Admitting: Internal Medicine

## 2020-12-28 DIAGNOSIS — Z1211 Encounter for screening for malignant neoplasm of colon: Secondary | ICD-10-CM | POA: Insufficient documentation

## 2020-12-28 LAB — BASIC METABOLIC PANEL
Anion gap: 8 (ref 5–15)
BUN: 9 mg/dL (ref 6–20)
CO2: 29 mmol/L (ref 22–32)
Calcium: 9.6 mg/dL (ref 8.9–10.3)
Chloride: 104 mmol/L (ref 98–111)
Creatinine, Ser: 1.05 mg/dL (ref 0.61–1.24)
GFR, Estimated: >60 mL/min (ref >60)
Glucose, Bld: 122 mg/dL — ABNORMAL HIGH (ref 70–99)
Potassium: 4.4 mmol/L (ref 3.5–5.1)
Sodium: 141 mmol/L (ref 135–145)

## 2021-01-01 ENCOUNTER — Encounter (HOSPITAL_COMMUNITY)
Admission: RE | Disposition: A | Discharge status: Home / Self Care | Payer: Self-pay | Source: Ambulatory Visit | Attending: Internal Medicine

## 2021-01-01 ENCOUNTER — Ambulatory Visit (HOSPITAL_COMMUNITY)
Admission: RE | Admit: 2021-01-01 | Discharge: 2021-01-01 | Disposition: A | Discharge status: Home / Self Care | Payer: BC Managed Care – PPO | Source: Ambulatory Visit | Attending: Internal Medicine | Admitting: Internal Medicine

## 2021-01-01 ENCOUNTER — Ambulatory Visit (HOSPITAL_COMMUNITY): Payer: BC Managed Care – PPO | Admitting: Anesthesiology

## 2021-01-01 ENCOUNTER — Encounter (HOSPITAL_COMMUNITY): Payer: Self-pay

## 2021-01-01 ENCOUNTER — Other Ambulatory Visit: Payer: Self-pay

## 2021-01-01 DIAGNOSIS — E119 Type 2 diabetes mellitus without complications: Secondary | ICD-10-CM | POA: Diagnosis not present

## 2021-01-01 DIAGNOSIS — Z1211 Encounter for screening for malignant neoplasm of colon: Secondary | ICD-10-CM | POA: Diagnosis not present

## 2021-01-01 DIAGNOSIS — K648 Other hemorrhoids: Secondary | ICD-10-CM | POA: Diagnosis not present

## 2021-01-01 DIAGNOSIS — K635 Polyp of colon: Secondary | ICD-10-CM | POA: Diagnosis not present

## 2021-01-01 DIAGNOSIS — D125 Benign neoplasm of sigmoid colon: Secondary | ICD-10-CM | POA: Insufficient documentation

## 2021-01-01 DIAGNOSIS — Z7984 Long term (current) use of oral hypoglycemic drugs: Secondary | ICD-10-CM | POA: Insufficient documentation

## 2021-01-01 DIAGNOSIS — Z79899 Other long term (current) drug therapy: Secondary | ICD-10-CM | POA: Diagnosis not present

## 2021-01-01 HISTORY — PX: COLONOSCOPY WITH PROPOFOL: SHX5780

## 2021-01-01 HISTORY — DX: Type 2 diabetes mellitus without complications: E11.9

## 2021-01-01 HISTORY — PX: POLYPECTOMY: SHX5525

## 2021-01-01 LAB — GLUCOSE, CAPILLARY: Glucose-Capillary: 118 mg/dL — ABNORMAL HIGH (ref 70–99)

## 2021-01-01 SURGERY — COLONOSCOPY WITH PROPOFOL
Anesthesia: General

## 2021-01-01 MED ORDER — LACTATED RINGERS IV SOLN: INTRAVENOUS | Status: DC

## 2021-01-01 MED ORDER — PROPOFOL 10 MG/ML IV BOLUS
INTRAVENOUS | Status: DC | PRN
  Administered 2021-01-01 (×2): 30 mg via INTRAVENOUS
  Administered 2021-01-01: 40 mg via INTRAVENOUS
  Administered 2021-01-01: 60 mg via INTRAVENOUS
  Administered 2021-01-01 (×2): 30 mg via INTRAVENOUS
  Administered 2021-01-01: 120 mg via INTRAVENOUS

## 2021-01-01 MED ORDER — PHENYLEPHRINE HCL (PRESSORS) 10 MG/ML IV SOLN
INTRAVENOUS | Status: DC | PRN
  Administered 2021-01-01 (×3): 80 ug via INTRAVENOUS

## 2021-01-01 NOTE — Anesthesia Preprocedure Evaluation (Addendum)
Anesthesia Evaluation  Patient identified by MRN, date of birth, ID band Patient awake    Reviewed: Allergy & Precautions, NPO status , Patient's Chart, lab work & pertinent test results  Airway Mallampati: II  TM Distance: >3 FB Neck ROM: Full   Comment: Neck pain Dental  (+) Dental Advisory Given, Teeth Intact   Pulmonary neg pulmonary ROS,    Pulmonary exam normal breath sounds clear to auscultation       Cardiovascular Exercise Tolerance: Good hypertension, Pt. on medications Normal cardiovascular exam Rhythm:Regular Rate:Normal     Neuro/Psych negative neurological ROS  negative psych ROS   GI/Hepatic Neg liver ROS, GERD  Medicated and Controlled,  Endo/Other  diabetes, Well Controlled, Type 2, Oral Hypoglycemic Agents  Renal/GU negative Renal ROS     Musculoskeletal negative musculoskeletal ROS (+)   Abdominal   Peds  Hematology  (+) Blood dyscrasia (Hb>18), ,   Anesthesia Other Findings   Reproductive/Obstetrics negative OB ROS                           Anesthesia Physical Anesthesia Plan  ASA: 2  Anesthesia Plan: General   Post-op Pain Management:    Induction: Intravenous  PONV Risk Score and Plan: TIVA  Airway Management Planned: Nasal Cannula and Natural Airway  Additional Equipment:   Intra-op Plan:   Post-operative Plan:   Informed Consent: I have reviewed the patients History and Physical, chart, labs and discussed the procedure including the risks, benefits and alternatives for the proposed anesthesia with the patient or authorized representative who has indicated his/her understanding and acceptance.     Dental advisory given  Plan Discussed with: CRNA and Surgeon  Anesthesia Plan Comments:         Anesthesia Quick Evaluation

## 2021-01-01 NOTE — H&P (Signed)
Primary Care Physician:  Renee Rival, NP Primary Gastroenterologist:  Dr. Abbey Chatters  Pre-Procedure History & Physical: HPI:  Derek Hines is a 45 y.o. male is here for a colonoscopy for colon cancer screening purposes.  Patient denies any family history of colorectal cancer.  No melena or hematochezia.  No abdominal pain or unintentional weight loss.  No change in bowel habits.  Overall feels well from a GI standpoint.  Past Medical History:  Diagnosis Date   Diabetes mellitus without complication ([place])    Hypercholesteremia    Hypertension     Past Surgical History:  Procedure Laterality Date   HERNIA REPAIR      Prior to Admission medications   Medication Sig Start Date End Date Taking? Authorizing Provider  buPROPion (WELLBUTRIN XL) 300 MG 24 hr tablet Take 300 mg by mouth in the morning. 10/03/19  Yes [provider]  DULoxetine (CYMBALTA) 30 MG capsule Take 30 mg by mouth in the morning.   Yes [provider]  fexofenadine (ALLEGRA) 180 MG tablet Take 180 mg by mouth in the morning.   Yes [provider]  hydrochlorothiazide (MICROZIDE) 12.5 MG capsule Take 12.5 mg by mouth in the morning. 11/21/20  Yes [provider]  lisinopril (PRINIVIL,ZESTRIL) 40 MG tablet Take 40 mg by mouth in the morning.   Yes [provider]  metFORMIN (GLUCOPHAGE-XR) 500 MG 24 hr tablet Take 1,000 mg by mouth 2 (two) times daily. 12/05/20  Yes [provider]  montelukast (SINGULAIR) 10 MG tablet Take 10 mg by mouth at bedtime.   Yes [provider]  Omega-3 Fatty Acids (FISH OIL) 1000 MG CAPS Take 2,000 mg by mouth at bedtime.   Yes [provider]  OZEMPIC, 0.25 OR 0.5 MG/DOSE, 2 MG/1.5ML SOPN Inject 0.5 mg into the skin every Sunday. 11/27/20  Yes [provider]  pravastatin (PRAVACHOL) 40 MG tablet Take 40 mg by mouth at bedtime. 12/05/20  Yes [provider]  triamcinolone (NASACORT) 55 MCG/ACT AERO nasal  inhaler Place 1-2 sprays into the nose daily as needed (allergies.).   Yes [provider]  acetaminophen (TYLENOL) 500 MG tablet Take 500-1,000 mg by mouth every 6 (six) hours as needed (for pain/headaches.).    [provider]  pantoprazole (PROTONIX) 40 MG tablet Take 40 mg by mouth in the morning. 12/14/20   [provider]    Allergies as of 12/18/2020   (No Known Allergies)    History reviewed. No pertinent family history.  Social History   Socioeconomic History   Marital status: Married    Spouse name: Not on file   Number of children: Not on file   Years of education: Not on file   Highest education level: Not on file  Occupational History   Not on file  Tobacco Use   Smoking status: Never   Smokeless tobacco: Never  Substance and Sexual Activity   Alcohol use: No   Drug use: No   Sexual activity: Not Currently  Other Topics Concern   Not on file  Social History Narrative   Not on file   Social Determinants of Health   Financial Resource Strain: Not on file  Food Insecurity: Not on file  Transportation Needs: Not on file  Physical Activity: Not on file  Stress: Not on file  Social Connections: Not on file  Intimate Partner Violence: Not on file    Review of Systems: See HPI, otherwise negative ROS  Physical Exam: Vital signs  in last 24 hours: Temp:  [98.5 F (36.9 C)] 98.5 F (36.9 C) (10/25 1128) Pulse Rate:  [92] 92 (10/25 1128) Resp:  [18] 18 (10/25 1128) BP: (123)/(80) 123/80 (10/25 1128) SpO2:  [98 %] 98 % (10/25 1128) Weight:  [95 kg] 95 kg (10/25 1128)   General:   Alert,  Well-developed, well-nourished, pleasant and cooperative in NAD Head:  Normocephalic and atraumatic. Eyes:  Sclera clear, no icterus.   Conjunctiva pink. Ears:  Normal auditory acuity. Nose:  No deformity, discharge,  or lesions. Mouth:  No deformity or lesions, dentition normal. Neck:  Supple; no masses or thyromegaly. Lungs:  Clear  throughout to auscultation.   No wheezes, crackles, or rhonchi. No acute distress. Heart:  Regular rate and rhythm; no murmurs, clicks, rubs,  or gallops. Abdomen:  Soft, nontender and nondistended. No masses, hepatosplenomegaly or hernias noted. Normal bowel sounds, without guarding, and without rebound.   Msk:  Symmetrical without gross deformities. Normal posture. Extremities:  Without clubbing or edema. Neurologic:  Alert and  oriented x4;  grossly normal neurologically. Skin:  Intact without significant lesions or rashes. Cervical Nodes:  No significant cervical adenopathy. Psych:  Alert and cooperative. Normal mood and affect.  Impression/Plan: Derek Hines is here for a colonoscopy to be performed for colon cancer screening purposes.  The risks of the procedure including infection, bleed, or perforation as well as benefits, limitations, alternatives and imponderables have been reviewed with the patient. Questions have been answered. All parties agreeable.

## 2021-01-01 NOTE — Anesthesia Postprocedure Evaluation (Signed)
Anesthesia Post Note  Patient: Derek Hines  Procedure(s) Performed: COLONOSCOPY WITH PROPOFOL POLYPECTOMY  Patient location during evaluation: Endoscopy Anesthesia Type: General Level of consciousness: awake and alert and oriented Pain management: pain level controlled Vital Signs Assessment: post-procedure vital signs reviewed and stable Respiratory status: spontaneous breathing, nonlabored ventilation and respiratory function stable Cardiovascular status: blood pressure returned to baseline and stable Postop Assessment: no apparent nausea or vomiting Anesthetic complications: no   No notable events documented.   Last Vitals:  Vitals:   01/01/21 1229 01/01/21 1230  BP:  100/73  Pulse: 96   Resp: 12   Temp: 36.5 C   SpO2: 93%     Last Pain:  Vitals:   01/01/21 1230  TempSrc:   PainSc: 0-No pain                 Brilyn Tuller C Jahel Wavra

## 2021-01-01 NOTE — Op Note (Signed)
[facility] Patient Name: Derek Hines Procedure Date: 01/01/2021 11:49 AM MRN: [id number] Date of Birth: Feb 14, 1976 Attending MD: Elon Alas. Abbey Chatters DO CSN: [id number] Age: 45 Admit Type: Outpatient Procedure:                Colonoscopy Indications:              Screening for colorectal malignant neoplasm Providers:                Elon Alas. Abbey Chatters, DO, Lambert Mody, Nelma Rothman, Technician Referring MD:              Medicines:                See the Anesthesia note for documentation of the                            administered medications Complications:            No immediate complications. Estimated Blood Loss:     Estimated blood loss was minimal. Procedure:                Pre-Anesthesia Assessment:                           - The anesthesia plan was to use monitored                            anesthesia care (MAC).                           After obtaining informed consent, the colonoscope                            was passed under direct vision. Throughout the                            procedure, the patient's blood pressure, pulse, and                            oxygen saturations were monitored continuously. The                            [id number]-HQ190L ([id number]) scope was introduced through                            the anus and advanced to the the cecum, identified                            by appendiceal orifice and ileocecal valve. The                            colonoscopy was performed without difficulty. The                            patient tolerated the procedure well. The quality  of the bowel preparation was evaluated using the                            BBPS [facility] Bowel Preparation Scale) with scores                            of: Right Colon = 2 (minor amount of residual                            staining, small fragments of stool and/or opaque                            liquid, but mucosa  seen well), Transverse Colon = 3                            (entire mucosa seen well with no residual staining,                            small fragments of stool or opaque liquid) and Left                            Colon = 3 (entire mucosa seen well with no residual                            staining, small fragments of stool or opaque                            liquid). The total BBPS score equals 8. The quality                            of the bowel preparation was good. Scope In: 12:05:13 PM Scope Out: 12:24:02 PM Scope Withdrawal Time: 0 hours 12 minutes 28 seconds  Total Procedure Duration: 0 hours 18 minutes 49 seconds  Findings:      The perianal and digital rectal examinations were normal.      Non-bleeding internal hemorrhoids were found during endoscopy.      A 5 mm polyp was found in the sigmoid colon. The polyp was       semi-pedunculated. The polyp was removed with a cold snare. Resection       and retrieval were complete.      The exam was otherwise without abnormality. Impression:               - Non-bleeding internal hemorrhoids.                           - One 5 mm polyp in the sigmoid colon, removed with                            a cold snare. Resected and retrieved.                           - The examination was otherwise normal. Moderate Sedation:      Per Anesthesia Care  Recommendation:           - Patient has a contact number available for                            emergencies. The signs and symptoms of potential                            delayed complications were discussed with the                            patient. Return to normal activities tomorrow.                            Written discharge instructions were provided to the                            patient.                           - Continue present medications.                           - Resume previous diet.                           - Await pathology results.                            - Repeat colonoscopy in 5 years for surveillance.                           - Return to GI clinic PRN. Procedure Code(s):        --- Professional ---                           [phone], Colonoscopy, flexible; with removal of                            tumor(s), polyp(s), or other lesion(s) by snare                            technique Diagnosis Code(s):        --- Professional ---                           Z12.11, Encounter for screening for malignant                            neoplasm of colon                           K63.5, Polyp of colon                           K64.8, Other hemorrhoids CPT copyright 2019 American [employer] Association. All rights reserved. The codes documented in this report are preliminary and upon coder review may  be revised to meet current compliance requirements. Elon Alas. Abbey Chatters, DO [place] Abbey Chatters, DO 01/01/2021 12:28:37 PM This report has been signed electronically. Number of Addenda: 0

## 2021-01-01 NOTE — Discharge Instructions (Addendum)

## 2021-01-01 NOTE — Transfer of Care (Signed)
Immediate Anesthesia Transfer of Care Note  Patient: Nolen Lindamood  Procedure(s) Performed: COLONOSCOPY WITH PROPOFOL POLYPECTOMY  Patient Location: Endoscopy Unit  Anesthesia Type:General  Level of Consciousness: drowsy  Airway & Oxygen Therapy: Patient Spontanous Breathing  Post-op Assessment: Report given to RN and Post -op Vital signs reviewed and stable  Post vital signs: Reviewed and stable  Last Vitals:  Vitals Value Taken Time  BP    Temp    Pulse    Resp    SpO2      Last Pain:  Vitals:   01/01/21 1202  TempSrc:   PainSc: 0-No pain      Patients Stated Pain Goal: 8 (38/18/29 9371)  Complications: No notable events documented.

## 2021-01-02 LAB — SURGICAL PATHOLOGY

## 2021-01-03 ENCOUNTER — Encounter (HOSPITAL_COMMUNITY): Payer: Self-pay | Admitting: Internal Medicine

## 2023-01-24 ENCOUNTER — Encounter (HOSPITAL_COMMUNITY): Payer: Self-pay

## 2023-01-24 ENCOUNTER — Inpatient Hospital Stay (HOSPITAL_COMMUNITY)
Admission: EM | Admit: 2023-01-24 | Discharge: 2023-01-26 | DRG: 638 | Disposition: A | Discharge status: Home / Self Care | Payer: BC Managed Care – PPO | Attending: Internal Medicine | Admitting: Internal Medicine

## 2023-01-24 ENCOUNTER — Other Ambulatory Visit: Payer: Self-pay

## 2023-01-24 DIAGNOSIS — Z6832 Body mass index (BMI) 32.0-32.9, adult: Secondary | ICD-10-CM

## 2023-01-24 DIAGNOSIS — Z5971 Insufficient health insurance coverage: Secondary | ICD-10-CM | POA: Diagnosis not present

## 2023-01-24 DIAGNOSIS — E782 Mixed hyperlipidemia: Secondary | ICD-10-CM | POA: Diagnosis present

## 2023-01-24 DIAGNOSIS — F32A Depression, unspecified: Secondary | ICD-10-CM | POA: Diagnosis present

## 2023-01-24 DIAGNOSIS — E66811 Obesity, class 1: Secondary | ICD-10-CM | POA: Diagnosis present

## 2023-01-24 DIAGNOSIS — E871 Hypo-osmolality and hyponatremia: Secondary | ICD-10-CM | POA: Diagnosis present

## 2023-01-24 DIAGNOSIS — E119 Type 2 diabetes mellitus without complications: Secondary | ICD-10-CM

## 2023-01-24 DIAGNOSIS — E860 Dehydration: Secondary | ICD-10-CM | POA: Diagnosis present

## 2023-01-24 DIAGNOSIS — Z7984 Long term (current) use of oral hypoglycemic drugs: Secondary | ICD-10-CM | POA: Diagnosis not present

## 2023-01-24 DIAGNOSIS — E669 Obesity, unspecified: Secondary | ICD-10-CM | POA: Insufficient documentation

## 2023-01-24 DIAGNOSIS — Z888 Allergy status to other drugs, medicaments and biological substances status: Secondary | ICD-10-CM

## 2023-01-24 DIAGNOSIS — N289 Disorder of kidney and ureter, unspecified: Secondary | ICD-10-CM

## 2023-01-24 DIAGNOSIS — E876 Hypokalemia: Secondary | ICD-10-CM | POA: Diagnosis not present

## 2023-01-24 DIAGNOSIS — Z79899 Other long term (current) drug therapy: Secondary | ICD-10-CM | POA: Diagnosis not present

## 2023-01-24 DIAGNOSIS — N179 Acute kidney failure, unspecified: Secondary | ICD-10-CM | POA: Insufficient documentation

## 2023-01-24 DIAGNOSIS — K219 Gastro-esophageal reflux disease without esophagitis: Secondary | ICD-10-CM | POA: Diagnosis present

## 2023-01-24 DIAGNOSIS — E1100 Type 2 diabetes mellitus with hyperosmolarity without nonketotic hyperglycemic-hyperosmolar coma (NKHHC): Principal | ICD-10-CM | POA: Insufficient documentation

## 2023-01-24 DIAGNOSIS — E7800 Pure hypercholesterolemia, unspecified: Secondary | ICD-10-CM

## 2023-01-24 DIAGNOSIS — I10 Essential (primary) hypertension: Secondary | ICD-10-CM | POA: Diagnosis present

## 2023-01-24 DIAGNOSIS — F419 Anxiety disorder, unspecified: Secondary | ICD-10-CM | POA: Diagnosis present

## 2023-01-24 DIAGNOSIS — E1110 Type 2 diabetes mellitus with ketoacidosis without coma: Secondary | ICD-10-CM | POA: Diagnosis not present

## 2023-01-24 LAB — CBC WITH DIFFERENTIAL/PLATELET
Abs Immature Granulocytes: 0.02 10*3/uL (ref 0.00–0.07)
Basophils Absolute: 0.1 10*3/uL (ref 0.0–0.1)
Basophils Relative: 1 %
Eosinophils Absolute: 0.2 10*3/uL (ref 0.0–0.5)
Eosinophils Relative: 2 %
HCT: 46.8 % (ref 39.0–52.0)
Hemoglobin: 16.3 g/dL (ref 13.0–17.0)
Immature Granulocytes: 0 %
Lymphocytes Relative: 22 %
Lymphs Abs: 1.7 10*3/uL (ref 0.7–4.0)
MCH: 28.8 pg (ref 26.0–34.0)
MCHC: 34.8 g/dL (ref 30.0–36.0)
MCV: 82.8 fL (ref 80.0–100.0)
Monocytes Absolute: 0.5 10*3/uL (ref 0.1–1.0)
Monocytes Relative: 7 %
Neutro Abs: 5.1 10*3/uL (ref 1.7–7.7)
Neutrophils Relative %: 68 %
Platelets: 272 10*3/uL (ref 150–400)
RBC: 5.65 MIL/uL (ref 4.22–5.81)
RDW: 12.1 % (ref 11.5–15.5)
WBC: 7.6 10*3/uL (ref 4.0–10.5)
nRBC: 0 % (ref 0.0–0.2)

## 2023-01-24 LAB — URINALYSIS, ROUTINE W REFLEX MICROSCOPIC
Bacteria, UA: NONE SEEN
Bilirubin Urine: NEGATIVE
Glucose, UA: >=500 mg/dL — AB
Hgb urine dipstick: NEGATIVE
Ketones, ur: NEGATIVE mg/dL
Leukocytes,Ua: NEGATIVE
Nitrite: NEGATIVE
Protein, ur: NEGATIVE mg/dL
Specific Gravity, Urine: 1.026 (ref 1.005–1.030)
pH: 5 (ref 5.0–8.0)

## 2023-01-24 LAB — BLOOD GAS, VENOUS
Acid-base deficit: 2.4 mmol/L — ABNORMAL HIGH (ref 0.0–2.0)
Bicarbonate: 23.2 mmol/L (ref 20.0–28.0)
FIO2: 21 %
O2 Saturation: 66.1 %
Patient temperature: 36.7
pCO2, Ven: 41 mm[Hg] — ABNORMAL LOW (ref 44–60)
pH, Ven: 7.35 (ref 7.25–7.43)
pO2, Ven: 35 mm[Hg] (ref 32–45)

## 2023-01-24 LAB — GLUCOSE, CAPILLARY
Glucose-Capillary: 172 mg/dL — ABNORMAL HIGH (ref 70–99)
Glucose-Capillary: 196 mg/dL — ABNORMAL HIGH (ref 70–99)
Glucose-Capillary: 235 mg/dL — ABNORMAL HIGH (ref 70–99)
Glucose-Capillary: 323 mg/dL — ABNORMAL HIGH (ref 70–99)

## 2023-01-24 LAB — BASIC METABOLIC PANEL
Anion gap: 16 — ABNORMAL HIGH (ref 5–15)
Anion gap: 11 (ref 5–15)
BUN: 25 mg/dL — ABNORMAL HIGH (ref 6–20)
BUN: 21 mg/dL — ABNORMAL HIGH (ref 6–20)
CO2: 19 mmol/L — ABNORMAL LOW (ref 22–32)
CO2: 25 mmol/L (ref 22–32)
Calcium: 8.9 mg/dL (ref 8.9–10.3)
Calcium: 8.8 mg/dL — ABNORMAL LOW (ref 8.9–10.3)
Chloride: 84 mmol/L — ABNORMAL LOW (ref 98–111)
Chloride: 95 mmol/L — ABNORMAL LOW (ref 98–111)
Creatinine, Ser: 1.49 mg/dL — ABNORMAL HIGH (ref 0.61–1.24)
Creatinine, Ser: 1.02 mg/dL (ref 0.61–1.24)
GFR, Estimated: 58 mL/min — ABNORMAL LOW (ref >60)
GFR, Estimated: >60 mL/min (ref >60)
Glucose, Bld: 904 mg/dL (ref 70–99)
Glucose, Bld: 210 mg/dL — ABNORMAL HIGH (ref 70–99)
Potassium: 4.1 mmol/L (ref 3.5–5.1)
Potassium: 3.8 mmol/L (ref 3.5–5.1)
Sodium: 119 mmol/L — CL (ref 135–145)
Sodium: 131 mmol/L — ABNORMAL LOW (ref 135–145)

## 2023-01-24 LAB — CBG MONITORING, ED
Glucose-Capillary: >600 mg/dL (ref 70–99)
Glucose-Capillary: >600 mg/dL (ref 70–99)

## 2023-01-24 LAB — BETA-HYDROXYBUTYRIC ACID: Beta-Hydroxybutyric Acid: 0.2 mmol/L (ref 0.05–0.27)

## 2023-01-24 LAB — MRSA NEXT GEN BY PCR, NASAL: MRSA by PCR Next Gen: NOT DETECTED

## 2023-01-24 MED ORDER — LACTATED RINGERS IV BOLUS
20.0000 mL/kg | Freq: Once | INTRAVENOUS | Status: AC
  Administered 2023-01-24: 1814 mL via INTRAVENOUS

## 2023-01-24 MED ORDER — POTASSIUM CHLORIDE 10 MEQ/100ML IV SOLN
10.0000 meq | INTRAVENOUS | Status: AC
Start: 2023-01-24 — End: 2023-01-25
  Administered 2023-01-24 – 2023-01-25 (×4): 10 meq via INTRAVENOUS
  Filled 2023-01-24: qty 100

## 2023-01-24 MED ORDER — PANTOPRAZOLE SODIUM 40 MG PO TBEC
40.0000 mg | DELAYED_RELEASE_TABLET | Freq: Every morning | ORAL | Status: DC
  Administered 2023-01-25 – 2023-01-26 (×2): 40 mg via ORAL
  Filled 2023-01-24 (×2): qty 1

## 2023-01-24 MED ORDER — INSULIN REGULAR(HUMAN) IN NACL 100-0.9 UT/100ML-% IV SOLN
INTRAVENOUS | Status: DC
  Administered 2023-01-24: 15 [IU]/h via INTRAVENOUS
  Filled 2023-01-24: qty 100

## 2023-01-24 MED ORDER — LORATADINE 10 MG PO TABS
10.0000 mg | ORAL_TABLET | Freq: Every day | ORAL | Status: DC
  Administered 2023-01-25 – 2023-01-26 (×2): 10 mg via ORAL
  Filled 2023-01-24 (×2): qty 1

## 2023-01-24 MED ORDER — DULOXETINE HCL 60 MG PO CPEP
60.0000 mg | ORAL_CAPSULE | Freq: Every day | ORAL | Status: DC
  Administered 2023-01-25 – 2023-01-26 (×2): 60 mg via ORAL
  Filled 2023-01-24 (×2): qty 1

## 2023-01-24 MED ORDER — BUPROPION HCL ER (XL) 150 MG PO TB24
300.0000 mg | ORAL_TABLET | Freq: Every morning | ORAL | Status: DC
  Administered 2023-01-25 – 2023-01-26 (×2): 300 mg via ORAL
  Filled 2023-01-24 (×2): qty 2

## 2023-01-24 MED ORDER — LACTATED RINGERS IV SOLN: INTRAVENOUS | Status: DC

## 2023-01-24 MED ORDER — PRAVASTATIN SODIUM 40 MG PO TABS
40.0000 mg | ORAL_TABLET | Freq: Every day | ORAL | Status: DC
  Administered 2023-01-25: 40 mg via ORAL
  Filled 2023-01-24: qty 1

## 2023-01-24 MED ORDER — MONTELUKAST SODIUM 10 MG PO TABS
10.0000 mg | ORAL_TABLET | Freq: Every day | ORAL | Status: DC
  Administered 2023-01-25: 10 mg via ORAL
  Filled 2023-01-24: qty 1

## 2023-01-24 MED ORDER — HYDROCHLOROTHIAZIDE 12.5 MG PO CAPS
12.5000 mg | ORAL_CAPSULE | Freq: Every morning | ORAL | Status: DC
  Filled 2023-01-24 (×2): qty 1

## 2023-01-24 MED ORDER — DEXTROSE IN LACTATED RINGERS 5 % IV SOLN: INTRAVENOUS | Status: DC

## 2023-01-24 MED ORDER — DEXTROSE 50 % IV SOLN: 0.0000 mL | INTRAVENOUS | Status: DC | PRN

## 2023-01-24 MED ORDER — LISINOPRIL 10 MG PO TABS: 40.0000 mg | ORAL_TABLET | Freq: Every morning | ORAL | Status: DC

## 2023-01-24 NOTE — ED Notes (Signed)
ED TO INPATIENT HANDOFF REPORT  ED Nurse Name and Phone #:  Jess F   S Name/Age/Gender Derek Hines 47 y.o. male Room/Bed: APA19/APA19  Code Status   Code Status: Not on file  Home/SNF/Other Home Patient oriented to: self, place, time, and situation Is this baseline? Yes   Triage Complete: Triage complete  Chief Complaint DKA (diabetic ketoacidosis) (HCC) [E11.10]  Triage Note Pt reports he takes metformin for diabetes and he has not checked his glucose in about a month.  Pt reports recent thirst, frequent urination and blurred vision so he checked his glucose and it read high.   Allergies Allergies  Allergen Reactions   Phenergan [Promethazine Hcl] Other (See Comments)    Causes hallucinations "sees frogs"   Tape Rash    "Burnt" his skin per pt    Level of Care/Admitting Diagnosis ED Disposition     ED Disposition  Admit   Condition  --   Comment  Hospital Area: [facility] [[id number]]  Level of Care: Stepdown [14]  Covid Evaluation: Asymptomatic - no recent exposure (last 10 days) testing not required  Diagnosis: DKA (diabetic ketoacidosis) [facility]) [[id number]]  Admitting Physician: Levie Heritage [[id number]]  Attending Physician: Levie Heritage [[id number]]  Certification:: I certify this patient will need inpatient services for at least 2 midnights  Expected Medical Readiness: 01/26/2023          B Medical/Surgery History Past Medical History:  Diagnosis Date   Diabetes mellitus without complication (HCC)    Hypercholesteremia    Hypertension    Past Surgical History:  Procedure Laterality Date   COLONOSCOPY WITH PROPOFOL N/A 01/01/2021   Procedure: COLONOSCOPY WITH PROPOFOL;  Surgeon: Lanelle Bal, DO;  Location[facility];  Service: Endoscopy;  Laterality: N/A;  1:00 / ASA II   HERNIA REPAIR     POLYPECTOMY  01/01/2021   Procedure: POLYPECTOMY;  Surgeon: Lanelle Bal, DO;  Location: [facility];  Service: Endoscopy;;     A IV  Location/Drains/Wounds Patient Lines/Drains/Airways Status     Active Line/Drains/Airways     Name Placement date Placement time Site Days   Peripheral IV 01/24/23 20 G 1" Anterior;Proximal;Right Forearm 01/24/23  1803  Forearm  less than 1            Intake/Output Last 24 hours No intake or output data in the 24 hours ending 01/24/23 1932  Labs/Imaging Results for orders placed or performed during the hospital encounter of 01/24/23 (from the past 48 hour(s))  CBG monitoring, ED     Status: Abnormal   Collection Time: 01/24/23  5:39 PM  Result Value Ref Range   Glucose-Capillary >600 (HH) 70 - 99 mg/dL    Comment: Glucose reference range applies only to samples taken after fasting for at least 8 hours.  Basic metabolic panel     Status: Abnormal   Collection Time: 01/24/23  6:20 PM  Result Value Ref Range   Sodium 119 (LL) 135 - 145 mmol/L    Comment: CRITICAL RESULT CALLED TO, READ BACK BY AND VERIFIED WITH FLETCHER,A AT 1850 ON 11.6.24 BY ISLEY,B   Potassium 4.1 3.5 - 5.1 mmol/L   Chloride 84 (L) 98 - 111 mmol/L   CO2 19 (L) 22 - 32 mmol/L   Glucose, Bld 904 (HH) 70 - 99 mg/dL    Comment: CRITICAL RESULT CALLED TO, READ BACK BY AND VERIFIED WITH FLETCHER,A AT 1850 ON 11.16.24 BY ISLEY,B Glucose reference range applies only to samples  taken after fasting for at least 8 hours.    BUN 25 (H) 6 - 20 mg/dL   Creatinine, Ser 1.61 (H) 0.61 - 1.24 mg/dL   Calcium 8.9 8.9 - 09.6 mg/dL   GFR, Estimated 58 (L) >60 mL/min    Comment: (NOTE) Calculated using the CKD-EPI Creatinine Equation (2021)    Anion gap 16 (H) 5 - 15    Comment: Performed at [facility], [address] [place], [place] [address]  Beta-hydroxybutyric acid     Status: None   Collection Time: 01/24/23  6:20 PM  Result Value Ref Range   Beta-Hydroxybutyric Acid 0.20 0.05 - 0.27 mmol/L    Comment: Performed at [facility], [address] [place], [place] [address]  CBC with Differential (PNL)     Status:  None   Collection Time: 01/24/23  6:20 PM  Result Value Ref Range   WBC 7.6 4.0 - 10.5 K/uL   RBC 5.65 4.22 - 5.81 MIL/uL   Hemoglobin 16.3 13.0 - 17.0 g/dL   HCT 14.7 82.9 - 56.2 %   MCV 82.8 80.0 - 100.0 fL   MCH 28.8 26.0 - 34.0 pg   MCHC 34.8 30.0 - 36.0 g/dL   RDW 13.0 86.5 - 78.4 %   Platelets 272 150 - 400 K/uL   nRBC 0.0 0.0 - 0.2 %   Neutrophils Relative % 68 %   Neutro Abs 5.1 1.7 - 7.7 K/uL   Lymphocytes Relative 22 %   Lymphs Abs 1.7 0.7 - 4.0 K/uL   Monocytes Relative 7 %   Monocytes Absolute 0.5 0.1 - 1.0 K/uL   Eosinophils Relative 2 %   Eosinophils Absolute 0.2 0.0 - 0.5 K/uL   Basophils Relative 1 %   Basophils Absolute 0.1 0.0 - 0.1 K/uL   Immature Granulocytes 0 %   Abs Immature Granulocytes 0.02 0.00 - 0.07 K/uL    Comment: Performed at [facility], [address] [place], [place] [address]  Urinalysis, Routine w reflex microscopic -Urine, Clean Catch     Status: Abnormal   Collection Time: 01/24/23  6:20 PM  Result Value Ref Range   Color, Urine COLORLESS (A) YELLOW   APPearance CLEAR CLEAR   Specific Gravity, Urine 1.026 1.005 - 1.030   pH 5.0 5.0 - 8.0   Glucose, UA >=500 (A) NEGATIVE mg/dL   Hgb urine dipstick NEGATIVE NEGATIVE   Bilirubin Urine NEGATIVE NEGATIVE   Ketones, ur NEGATIVE NEGATIVE mg/dL   Protein, ur NEGATIVE NEGATIVE mg/dL   Nitrite NEGATIVE NEGATIVE   Leukocytes,Ua NEGATIVE NEGATIVE   RBC / HPF 0-5 0 - 5 RBC/hpf   WBC, UA 0-5 0 - 5 WBC/hpf   Bacteria, UA NONE SEEN NONE SEEN   Squamous Epithelial / HPF 0-5 0 - 5 /HPF    Comment: Performed at [facility], [address] [place], [place] [address]  Blood gas, venous     Status: Abnormal   Collection Time: 01/24/23  6:39 PM  Result Value Ref Range   FIO2 21.00 %   pH, Ven 7.35 7.25 - 7.43   pCO2, Ven 41 (L) 44 - 60 mmHg   pO2, Ven 35 32 - 45 mmHg   Bicarbonate 23.2 20.0 - 28.0 mmol/L   Acid-base deficit 2.4 (H) 0.0 - 2.0 mmol/L   O2 Saturation 66.1 %   Patient temperature  36.7    Collection site BLOOD LEFT HAND    Drawn by Cheri Fowler     Comment: Performed at [facility], [address], [place], [place] [address]   No results found.  Pending Labs [employer] (From admission, onward)     Start     Ordered   01/24/23 1756  Basic metabolic panel  (Diabetes Ketoacidosis (DKA))  STAT Now then every 4 hours ,   STAT      01/24/23 1756   01/24/23 1756  Beta-hydroxybutyric acid  (Diabetes Ketoacidosis (DKA))  Now then every 8 hours,   STAT (with URGENT occurrences)      01/24/23 1756            Vitals/Pain Today's Vitals   01/24/23 1742 01/24/23 1849 01/24/23 1914 01/24/23 1915  BP:  (!) 114/58    Pulse:  (!) 109    Resp:    20  Temp:      SpO2:  97%    Weight: 90.7 kg     Height: 5\' 6"  (1.676 m)     PainSc:   0-No pain     Isolation Precautions No active isolations  Medications Medications  insulin regular, human (MYXREDLIN) 100 units/ 100 mL infusion (15 Units/hr Intravenous New Bag/Given 01/24/23 1906)  lactated ringers infusion (has no administration in time range)  dextrose 5 % in lactated ringers infusion (has no administration in time range)  dextrose 50 % solution 0-50 mL (has no administration in time range)  lactated ringers bolus 1,814 mL (1,814 mLs Intravenous New Bag/Given 01/24/23 1810)    Mobility walks     Focused Assessments Renal Assessment Handoff:  H  R Recommendations: See Admitting Provider Note  Report given to:   Additional Notes: n/a

## 2023-01-24 NOTE — H&P (Signed)
History and Physical    Patient: Derek Hines YQM:[id number] DOB: [birth year] DOA: 01/24/2023 DOS: the patient was seen and examined on 01/24/2023 PCP: Erasmo Downer, NP  Patient coming from: Home  Chief Complaint:  Chief Complaint  Patient presents with   Hyperglycemia   HPI: Derek Hines is a 47 y.o. male with medical history significant of diabetes, hypertension, hyperlipidemia.  Patient has been on metformin and Ozempic for his diabetes under fairly good control.  3 months ago, he was unable to refill his Ozempic due to not getting approval through his insurance.  He has just been trying to take his metformin in order to control his blood sugars.  He has not followed up with his primary care physician.  He has been having progressive urinary frequency, excessive thirst, blurred vision.  He checked his blood sugar at home today and it read "too high".  No chest pain, shortness of breath, nausea, vomiting Review of Systems: As mentioned in the history of present illness. All other systems reviewed and are negative. Past Medical History:  Diagnosis Date   Diabetes mellitus without complication (HCC)    Hypercholesteremia    Hypertension    Past Surgical History:  Procedure Laterality Date   COLONOSCOPY WITH PROPOFOL N/A 01/01/2021   Procedure: COLONOSCOPY WITH PROPOFOL;  Surgeon: Lanelle Bal, DO;  Location: [facility];  Service: Endoscopy;  Laterality: N/A;  1:00 / ASA II   HERNIA REPAIR     POLYPECTOMY  01/01/2021   Procedure: POLYPECTOMY;  Surgeon: Lanelle Bal, DO;  Location: [facility];  Service: Endoscopy;;   Social History:  reports that he has never smoked. He has never used smokeless tobacco. He reports that he does not drink alcohol and does not use drugs.  Allergies  Allergen Reactions   Phenergan [Promethazine Hcl] Other (See Comments)    Causes hallucinations "sees frogs"   Tape Rash    "Burnt" his skin per pt    History reviewed. No  pertinent family history.  Prior to Admission medications   Medication Sig Start Date End Date Taking? Authorizing Provider  buPROPion (WELLBUTRIN XL) 300 MG 24 hr tablet Take 300 mg by mouth in the morning. 10/03/19  Yes [provider]  DULoxetine (CYMBALTA) 60 MG capsule Take 60 mg by mouth daily. 01/19/23  Yes [provider]  fexofenadine (ALLEGRA) 180 MG tablet Take 180 mg by mouth in the morning.   Yes [provider]  hydrochlorothiazide (MICROZIDE) 12.5 MG capsule Take 12.5 mg by mouth in the morning. 11/21/20  Yes [provider]  lisinopril (PRINIVIL,ZESTRIL) 40 MG tablet Take 40 mg by mouth in the morning.   Yes [provider]  metFORMIN (GLUCOPHAGE-XR) 500 MG 24 hr tablet Take 500 mg by mouth 2 (two) times daily. 12/05/20  Yes [provider]  montelukast (SINGULAIR) 10 MG tablet Take 10 mg by mouth at bedtime.   Yes [provider]  naproxen (NAPROSYN) 500 MG tablet Take 500 mg by mouth every 12 (twelve) hours as needed for mild pain (pain score 1-3). 01/02/23  Yes [provider]  Omega-3 Fatty Acids (FISH OIL) 1000 MG CAPS Take 2,000 mg by mouth at bedtime.   Yes [provider]  pantoprazole (PROTONIX) 40 MG tablet Take 40 mg by mouth in the morning. 12/14/20  Yes [provider]  pravastatin (PRAVACHOL) 40 MG tablet Take 40 mg by mouth at bedtime. 12/05/20  Yes [provider]  triamcinolone (NASACORT) 55 MCG/ACT  AERO nasal inhaler Place 1-2 sprays into the nose daily as needed (allergies.).   Yes [provider]    Physical Exam: Vitals:   01/24/23 1740 01/24/23 1742 01/24/23 1849 01/24/23 1915  BP: 107/69  (!) 114/58   Pulse: (!) 122  (!) 109   Resp: 19   20  Temp: 98 F (36.7 C)     SpO2: 97%  97%   Weight:  90.7 kg    Height:  5\' 6"  (1.676 m)     General: middle age male. Awake and alert and oriented x3. No acute cardiopulmonary distress.  HEENT: Normocephalic  atraumatic.  Right and left ears normal in appearance.  Pupils equal, round, reactive to light. Extraocular muscles are intact. Sclerae anicteric and noninjected.  Moist mucosal membranes. No mucosal lesions.  Neck: Neck supple without lymphadenopathy. No carotid bruits. No masses palpated.  Cardiovascular: Regular rate with normal S1-S2 sounds. No murmurs, rubs, gallops auscultated. No JVD.  Respiratory: Good respiratory effort with no wheezes, rales, rhonchi. Lungs clear to auscultation bilaterally.  No accessory muscle use. Abdomen: Soft, nontender, nondistended. Active bowel sounds. No masses or hepatosplenomegaly  Skin: No rashes, lesions, or ulcerations.  Dry, warm to touch. 2+ dorsalis pedis and radial pulses. Musculoskeletal: No calf or leg pain. All major joints not erythematous nontender.  No upper or lower joint deformation.  Good ROM.  No contractures  Psychiatric: Intact judgment and insight. Pleasant and cooperative. Neurologic: No focal neurological deficits. Strength is 5/5 and symmetric in upper and lower extremities.  Cranial nerves II through XII are grossly intact.  Data Reviewed: Results for orders placed or performed during the hospital encounter of 01/24/23 (from the past 24 hour(s))  CBG monitoring, ED     Status: Abnormal   Collection Time: 01/24/23  5:39 PM  Result Value Ref Range   Glucose-Capillary >600 (HH) 70 - 99 mg/dL  Basic metabolic panel     Status: Abnormal   Collection Time: 01/24/23  6:20 PM  Result Value Ref Range   Sodium 119 (LL) 135 - 145 mmol/L   Potassium 4.1 3.5 - 5.1 mmol/L   Chloride 84 (L) 98 - 111 mmol/L   CO2 19 (L) 22 - 32 mmol/L   Glucose, Bld 904 (HH) 70 - 99 mg/dL   BUN 25 (H) 6 - 20 mg/dL   Creatinine, Ser 1.61 (H) 0.61 - 1.24 mg/dL   Calcium 8.9 8.9 - 09.6 mg/dL   GFR, Estimated 58 (L) >60 mL/min   Anion gap 16 (H) 5 - 15  Beta-hydroxybutyric acid     Status: None   Collection Time: 01/24/23  6:20 PM  Result Value Ref Range    Beta-Hydroxybutyric Acid 0.20 0.05 - 0.27 mmol/L  CBC with Differential (PNL)     Status: None   Collection Time: 01/24/23  6:20 PM  Result Value Ref Range   WBC 7.6 4.0 - 10.5 K/uL   RBC 5.65 4.22 - 5.81 MIL/uL   Hemoglobin 16.3 13.0 - 17.0 g/dL   HCT 04.5 40.9 - 81.1 %   MCV 82.8 80.0 - 100.0 fL   MCH 28.8 26.0 - 34.0 pg   MCHC 34.8 30.0 - 36.0 g/dL   RDW 91.4 78.2 - 95.6 %   Platelets 272 150 - 400 K/uL   nRBC 0.0 0.0 - 0.2 %   Neutrophils Relative % 68 %   Neutro Abs 5.1 1.7 - 7.7 K/uL   Lymphocytes Relative 22 %   Lymphs Abs 1.7 0.7 -  4.0 K/uL   Monocytes Relative 7 %   Monocytes Absolute 0.5 0.1 - 1.0 K/uL   Eosinophils Relative 2 %   Eosinophils Absolute 0.2 0.0 - 0.5 K/uL   Basophils Relative 1 %   Basophils Absolute 0.1 0.0 - 0.1 K/uL   Immature Granulocytes 0 %   Abs Immature Granulocytes 0.02 0.00 - 0.07 K/uL  Urinalysis, Routine w reflex microscopic -Urine, Clean Catch     Status: Abnormal   Collection Time: 01/24/23  6:20 PM  Result Value Ref Range   Color, Urine COLORLESS (A) YELLOW   APPearance CLEAR CLEAR   Specific Gravity, Urine 1.026 1.005 - 1.030   pH 5.0 5.0 - 8.0   Glucose, UA >=500 (A) NEGATIVE mg/dL   Hgb urine dipstick NEGATIVE NEGATIVE   Bilirubin Urine NEGATIVE NEGATIVE   Ketones, ur NEGATIVE NEGATIVE mg/dL   Protein, ur NEGATIVE NEGATIVE mg/dL   Nitrite NEGATIVE NEGATIVE   Leukocytes,Ua NEGATIVE NEGATIVE   RBC / HPF 0-5 0 - 5 RBC/hpf   WBC, UA 0-5 0 - 5 WBC/hpf   Bacteria, UA NONE SEEN NONE SEEN   Squamous Epithelial / HPF 0-5 0 - 5 /HPF  Blood gas, venous     Status: Abnormal   Collection Time: 01/24/23  6:39 PM  Result Value Ref Range   FIO2 21.00 %   pH, Ven 7.35 7.25 - 7.43   pCO2, Ven 41 (L) 44 - 60 mmHg   pO2, Ven 35 32 - 45 mmHg   Bicarbonate 23.2 20.0 - 28.0 mmol/L   Acid-base deficit 2.4 (H) 0.0 - 2.0 mmol/L   O2 Saturation 66.1 %   Patient temperature 36.7    Collection site BLOOD LEFT HAND    Drawn by Cheri Fowler     No  results found.   Assessment and Plan: No notes have been filed under this hospital service. Service: Hospitalist  Principal Problem:   DKA (diabetic ketoacidosis) (HCC) Active Problems:   Hypertension   Hypercholesteremia   Diabetes mellitus without complication (HCC)  DKA Diabetes admit to stepdown Telemetry monitoring Continue aggressive IV hydration CBGs every hour Basic metabolic panel every 4 hours Potassium replacement: 10 mEq IV runs 3 Noncaloric liquids Transition to D5 normal saline at 150 mL's per hour with CBG at 250 Transition to home insulin regimen when gap is closed and acidosis resolved. Hypertension Continue antihypertensives. Hyperlipidemia   Advance Care Planning:   Code Status: Not on file full code confirmed by patient  Consults: none  Family Communication: wife present during interview and exam  Severity of Illness: The appropriate patient status for this patient is INPATIENT. Inpatient status is judged to be reasonable and necessary in order to provide the required intensity of service to ensure the patient's safety. The patient's presenting symptoms, physical exam findings, and initial radiographic and laboratory data in the context of their chronic comorbidities is felt to place them at high risk for further clinical deterioration. Furthermore, it is not anticipated that the patient will be medically stable for discharge from the hospital within 2 midnights of admission.   * I certify that at the point of admission it is my clinical judgment that the patient will require inpatient hospital care spanning beyond 2 midnights from the point of admission due to high intensity of service, high risk for further deterioration and high frequency of surveillance required.*  Author: Levie Heritage, DO 01/24/2023 7:16 PM  For on call review [email or web address].

## 2023-01-24 NOTE — ED Triage Notes (Signed)
Pt reports he takes metformin for diabetes and he has not checked his glucose in about a month.  Pt reports recent thirst, frequent urination and blurred vision so he checked his glucose and it read high.

## 2023-01-24 NOTE — Plan of Care (Signed)

## 2023-01-24 NOTE — ED Notes (Signed)
CBG obtained during triage, over 600.

## 2023-01-24 NOTE — ED Provider Notes (Signed)
[place] [facility]GENCY DEPARTMENT AT [facility] Provider Note   CSN: [id number] Arrival date & time: 01/24/23  1732     History  Chief Complaint  Patient presents with   Hyperglycemia    Derek Hines is a 47 y.o. male.   Hyperglycemia  This patient is a 47 year old male, he has a known history of diabetes hypertension and high cholesterol, he is on a combination of different medications including metformin, lisinopril, hydrochlorothiazide, rosuvastatin and had previously been on Ozempic however this medication was not refilled as of 3 months ago due to insurance issues.  After the Ozempic was unable to be refilled he has not followed up with his family doctor for further evaluation but has noticed over the last month despite taking metformin 500 mg twice daily he has had progressive urinary frequency, excessive thirst and is now starting to have some generalized blurry vision.  He has no chest pain or shortness of breath, he does feel little bit lightheaded when he stands sometimes and he has had some unexpected weight loss.  No diarrhea, no fever, no swelling of the legs, no rashes.  He has not seen his family doctor in a couple of months.  He has not run out of his metformin, he has never been on insulin.  When he checked his blood sugar at home today it was too high to read    Home Medications Prior to Admission medications   Medication Sig Start Date End Date Taking? Authorizing Provider  buPROPion (WELLBUTRIN XL) 300 MG 24 hr tablet Take 300 mg by mouth in the morning. 10/03/19  Yes [provider]  DULoxetine (CYMBALTA) 60 MG capsule Take 60 mg by mouth daily. 01/19/23  Yes [provider]  fexofenadine (ALLEGRA) 180 MG tablet Take 180 mg by mouth in the morning.   Yes [provider]  hydrochlorothiazide (MICROZIDE) 12.5 MG capsule Take 12.5 mg by mouth in the morning. 11/21/20  Yes [provider]  lisinopril (PRINIVIL,ZESTRIL) 40  MG tablet Take 40 mg by mouth in the morning.   Yes [provider]  metFORMIN (GLUCOPHAGE-XR) 500 MG 24 hr tablet Take 500 mg by mouth 2 (two) times daily. 12/05/20  Yes [provider]  montelukast (SINGULAIR) 10 MG tablet Take 10 mg by mouth at bedtime.   Yes [provider]  naproxen (NAPROSYN) 500 MG tablet Take 500 mg by mouth every 12 (twelve) hours as needed for mild pain (pain score 1-3). 01/02/23  Yes [provider]  Omega-3 Fatty Acids (FISH OIL) 1000 MG CAPS Take 2,000 mg by mouth at bedtime.   Yes [provider]  pantoprazole (PROTONIX) 40 MG tablet Take 40 mg by mouth in the morning. 12/14/20  Yes [provider]  pravastatin (PRAVACHOL) 40 MG tablet Take 40 mg by mouth at bedtime. 12/05/20  Yes [provider]  triamcinolone (NASACORT) 55 MCG/ACT AERO nasal inhaler Place 1-2 sprays into the nose daily as needed (allergies.).   Yes [provider]      Allergies    Phenergan [promethazine hcl] and Tape    Review of Systems   Review of Systems  All other systems reviewed and are negative.   Physical Exam Updated Vital Signs BP (!) 114/58   Pulse (!) 109   Temp 98 F (36.7 C)   Resp 19   Ht 1.676 m (5\' 6" )   Wt 90.7 kg   SpO2 97%   BMI 32.28 kg/m  Physical Exam Vitals  and nursing note reviewed.  Constitutional:      General: He is not in acute distress.    Appearance: He is well-developed.  HENT:     Head: Normocephalic and atraumatic.     Mouth/Throat:     Pharynx: No oropharyngeal exudate.  Eyes:     General: No scleral icterus.       Right eye: No discharge.        Left eye: No discharge.     Conjunctiva/sclera: Conjunctivae normal.     Pupils: Pupils are equal, round, and reactive to light.  Neck:     Thyroid: No thyromegaly.     Vascular: No JVD.  Cardiovascular:     Rate and Rhythm: Regular rhythm. Tachycardia present.     Heart sounds: Normal heart sounds. No murmur heard.     No friction rub. No gallop.  Pulmonary:     Effort: Pulmonary effort is normal. No respiratory distress.     Breath sounds: Normal breath sounds. No wheezing or rales.  Abdominal:     General: Bowel sounds are normal. There is no distension.     Palpations: Abdomen is soft. There is no mass.     Tenderness: There is no abdominal tenderness.  Musculoskeletal:        General: No tenderness. Normal range of motion.     Cervical back: Normal range of motion and neck supple.  Lymphadenopathy:     Cervical: No cervical adenopathy.  Skin:    General: Skin is warm and dry.     Findings: No erythema or rash.  Neurological:     Mental Status: He is alert.     Coordination: Coordination normal.  Psychiatric:        Behavior: Behavior normal.     ED Results / Procedures / Treatments   Labs (all labs ordered are listed, but only abnormal results are displayed) Labs Reviewed  BASIC METABOLIC PANEL - Abnormal; Notable for the following components:      Result Value   Sodium 119 (*)    Chloride 84 (*)    CO2 19 (*)    Glucose, Bld 904 (*)    BUN 25 (*)    Creatinine, Ser 1.49 (*)    GFR, Estimated 58 (*)    Anion gap 16 (*)    All other components within normal limits  URINALYSIS, ROUTINE W REFLEX MICROSCOPIC - Abnormal; Notable for the following components:   Color, Urine COLORLESS (*)    Glucose, UA >=500 (*)    All other components within normal limits  BLOOD GAS, VENOUS - Abnormal; Notable for the following components:   pCO2, Ven 41 (*)    Acid-base deficit 2.4 (*)    All other components within normal limits  CBG MONITORING, ED - Abnormal; Notable for the following components:   Glucose-Capillary >600 (*)    All other components within normal limits  BETA-HYDROXYBUTYRIC ACID  CBC WITH DIFFERENTIAL/PLATELET  BASIC METABOLIC PANEL  BASIC METABOLIC PANEL  BASIC METABOLIC PANEL  BETA-HYDROXYBUTYRIC ACID  CBG MONITORING, ED    EKG EKG Interpretation Date/Time:  Saturday  January 24 2023 18:58:11 EST Ventricular Rate:  109 PR Interval:  144 QRS Duration:  99 QT Interval:  345 QTC Calculation: 465 R Axis:   72  Text Interpretation: Sinus tachycardia Probable left atrial enlargement RSR' in V1 or V2, probably normal variant No old tracing to compare Confirmed by Eber Hong ([id number]) on 01/24/2023 7:05:26 PM  Radiology No results found.  Procedures .Critical Care  Performed by: Eber Hong, MD Authorized by: Eber Hong, MD   Critical care provider statement:    Critical care time (minutes):  45   Critical care time was exclusive of:  Separately billable procedures and treating other patients and teaching time   Critical care was necessary to treat or prevent imminent or life-threatening deterioration of the following conditions:  Endocrine crisis   Critical care was time spent personally by me on the following activities:  Development of treatment plan with patient or surrogate, discussions with consultants, evaluation of patient's response to treatment, examination of patient, obtaining history from patient or surrogate, review of old charts, re-evaluation of patient's condition, pulse oximetry, ordering and review of radiographic studies, ordering and review of laboratory studies and ordering and performing treatments and interventions   I assumed direction of critical care for this patient from another provider in my specialty: no     Care discussed with: admitting provider   Comments:           Medications Ordered in ED Medications  insulin regular, human (MYXREDLIN) 100 units/ 100 mL infusion (15 Units/hr Intravenous New Bag/Given 01/24/23 1906)  lactated ringers infusion (has no administration in time range)  dextrose 5 % in lactated ringers infusion (has no administration in time range)  dextrose 50 % solution 0-50 mL (has no administration in time range)  lactated ringers bolus 1,814 mL (1,814 mLs Intravenous New Bag/Given 01/24/23  1810)    ED Course/ Medical Decision Making/ A&P                                 Medical Decision Making Amount and/or Complexity of Data Reviewed Labs: ordered.  Risk Prescription drug management. Decision regarding hospitalization.    This patient presents to the ED for concern of hyperglycemia, this involves an extensive number of treatment options, and is a complaint that carries with it a high risk of complications and morbidity.  The differential diagnosis includes decompensated diabetes, consider DKA, hyperglycemia, dehydration, ketosis, underlying infection seems less likely, no chest pain or neurologic symptoms to suggest stroke, neurologic causes, cardiac causes or infection   Co morbidities that complicate the patient evaluation  Diabetes hypertension high cholesterol   Additional history obtained:  Additional history obtained from the record External records from outside source obtained and reviewed including prior admission to the hospital a couple years ago for colonoscopy, no other recent admissions to the hospital, office notes not available as he [facility] to [facility]   Lab Tests:  I Ordered, and personally interpreted labs.  The pertinent results include: Lab work shows hyponatremia at 119 with a glucose of 904 CO2 of 19 a creatinine of 1.49 consistent with an acute kidney injury and likely severe dehydration.  There is an increased anion gap of 16 consistent with an acidosis.  Beta hydroxybutyric acid is 0.2, CBC is unremarkable, urinalysis not consistent with infection.  Cardiac Monitoring: / EKG:  The patient was maintained on a cardiac monitor.  I personally viewed and interpreted the cardiac monitored which showed an underlying rhythm of: Sinus tachycardia   Consultations Obtained:  I requested consultation with the hospitalist,  and discussed lab and imaging findings as well as pertinent plan - they recommend: Patient to the  hospital on insulin drip   Problem List / ED Course / Critical interventions / Medication management  The patient is quite  dehydrated, he does have what appears to be an increased anion gap acidosis though the acidosis is minimal based on an ABG which shows a pH of 7.35 and anion gap of 16 and a CO2 of 19 however there is significant pseudohyponatremia with hyperglycemia of over 900. I ordered medication including IV fluids and insulin for early DKA Reevaluation of the patient after these medicines showed that the patient slightly improved but critically ill I have reviewed the patients home medicines and have made adjustments as needed   Social Determinants of Health:  Decompensated diabetes, will need admission to higher level of care   Test / Admission - Considered:  Admit to higher level of care         Final Clinical Impression(s) / ED Diagnoses Final diagnoses:  Diabetic ketoacidosis without coma associated with type 2 diabetes mellitus [facility])    Rx / DC Orders ED Discharge Orders     None         Eber Hong, MD 01/24/23 1907

## 2023-01-25 DIAGNOSIS — N179 Acute kidney failure, unspecified: Secondary | ICD-10-CM | POA: Diagnosis not present

## 2023-01-25 DIAGNOSIS — E871 Hypo-osmolality and hyponatremia: Secondary | ICD-10-CM

## 2023-01-25 DIAGNOSIS — E1100 Type 2 diabetes mellitus with hyperosmolarity without nonketotic hyperglycemic-hyperosmolar coma (NKHHC): Principal | ICD-10-CM | POA: Insufficient documentation

## 2023-01-25 DIAGNOSIS — E669 Obesity, unspecified: Secondary | ICD-10-CM | POA: Diagnosis not present

## 2023-01-25 LAB — GLUCOSE, CAPILLARY
Glucose-Capillary: 158 mg/dL — ABNORMAL HIGH (ref 70–99)
Glucose-Capillary: 160 mg/dL — ABNORMAL HIGH (ref 70–99)
Glucose-Capillary: 163 mg/dL — ABNORMAL HIGH (ref 70–99)
Glucose-Capillary: 189 mg/dL — ABNORMAL HIGH (ref 70–99)
Glucose-Capillary: 192 mg/dL — ABNORMAL HIGH (ref 70–99)
Glucose-Capillary: 197 mg/dL — ABNORMAL HIGH (ref 70–99)
Glucose-Capillary: 197 mg/dL — ABNORMAL HIGH (ref 70–99)
Glucose-Capillary: 198 mg/dL — ABNORMAL HIGH (ref 70–99)
Glucose-Capillary: 214 mg/dL — ABNORMAL HIGH (ref 70–99)
Glucose-Capillary: 215 mg/dL — ABNORMAL HIGH (ref 70–99)
Glucose-Capillary: 224 mg/dL — ABNORMAL HIGH (ref 70–99)
Glucose-Capillary: 231 mg/dL — ABNORMAL HIGH (ref 70–99)
Glucose-Capillary: 233 mg/dL — ABNORMAL HIGH (ref 70–99)
Glucose-Capillary: 248 mg/dL — ABNORMAL HIGH (ref 70–99)

## 2023-01-25 LAB — BASIC METABOLIC PANEL
Anion gap: 7 (ref 5–15)
Anion gap: 6 (ref 5–15)
BUN: 16 mg/dL (ref 6–20)
BUN: 15 mg/dL (ref 6–20)
CO2: 25 mmol/L (ref 22–32)
CO2: 25 mmol/L (ref 22–32)
Calcium: 8.3 mg/dL — ABNORMAL LOW (ref 8.9–10.3)
Calcium: 8.1 mg/dL — ABNORMAL LOW (ref 8.9–10.3)
Chloride: 101 mmol/L (ref 98–111)
Chloride: 101 mmol/L (ref 98–111)
Creatinine, Ser: 0.99 mg/dL (ref 0.61–1.24)
Creatinine, Ser: 0.9 mg/dL (ref 0.61–1.24)
GFR, Estimated: >60 mL/min (ref >60)
GFR, Estimated: >60 mL/min (ref >60)
Glucose, Bld: 220 mg/dL — ABNORMAL HIGH (ref 70–99)
Glucose, Bld: 168 mg/dL — ABNORMAL HIGH (ref 70–99)
Potassium: 3.6 mmol/L (ref 3.5–5.1)
Potassium: 3.4 mmol/L — ABNORMAL LOW (ref 3.5–5.1)
Sodium: 133 mmol/L — ABNORMAL LOW (ref 135–145)
Sodium: 132 mmol/L — ABNORMAL LOW (ref 135–145)

## 2023-01-25 LAB — HEMOGLOBIN A1C
Hgb A1c MFr Bld: 11.9 % — ABNORMAL HIGH (ref 4.8–5.6)
Mean Plasma Glucose: 294.83 mg/dL

## 2023-01-25 LAB — BETA-HYDROXYBUTYRIC ACID: Beta-Hydroxybutyric Acid: 0.09 mmol/L (ref 0.05–0.27)

## 2023-01-25 MED ORDER — POTASSIUM CHLORIDE CRYS ER 20 MEQ PO TBCR
20.0000 meq | EXTENDED_RELEASE_TABLET | Freq: Once | ORAL | Status: AC
Start: 2023-01-25 — End: 2023-01-25
  Administered 2023-01-25: 20 meq via ORAL
  Filled 2023-01-25: qty 1

## 2023-01-25 MED ORDER — ACETAMINOPHEN 325 MG PO TABS
650.0000 mg | ORAL_TABLET | Freq: Four times a day (QID) | ORAL | Status: DC | PRN
  Administered 2023-01-25: 650 mg via ORAL
  Filled 2023-01-25: qty 2

## 2023-01-25 MED ORDER — INSULIN ASPART 100 UNIT/ML IJ SOLN
0.0000 [IU] | Freq: Three times a day (TID) | INTRAMUSCULAR | Status: DC
Start: 2023-01-25 — End: 2023-01-26
  Administered 2023-01-25: 5 [IU] via SUBCUTANEOUS
  Administered 2023-01-25: 3 [IU] via SUBCUTANEOUS
  Administered 2023-01-26 (×2): 8 [IU] via SUBCUTANEOUS

## 2023-01-25 MED ORDER — LACTATED RINGERS IV SOLN: INTRAVENOUS | Status: AC

## 2023-01-25 MED ORDER — ONDANSETRON HCL 4 MG/2ML IJ SOLN: 4.0000 mg | Freq: Four times a day (QID) | INTRAMUSCULAR | Status: DC | PRN

## 2023-01-25 MED ORDER — INSULIN ASPART 100 UNIT/ML IJ SOLN
0.0000 [IU] | Freq: Every day | INTRAMUSCULAR | Status: DC
  Administered 2023-01-25: 2 [IU] via SUBCUTANEOUS

## 2023-01-25 MED ORDER — INSULIN ASPART 100 UNIT/ML IJ SOLN: 0.0000 [IU] | Freq: Three times a day (TID) | INTRAMUSCULAR | Status: DC

## 2023-01-25 MED ORDER — INSULIN GLARGINE-YFGN 100 UNIT/ML ~~LOC~~ SOLN
18.0000 [IU] | Freq: Every day | SUBCUTANEOUS | Status: DC
  Administered 2023-01-25 – 2023-01-26 (×2): 18 [IU] via SUBCUTANEOUS
  Filled 2023-01-25 (×3): qty 0.18

## 2023-01-25 MED ORDER — CHLORHEXIDINE GLUCONATE CLOTH 2 % EX PADS
6.0000 | MEDICATED_PAD | Freq: Every day | CUTANEOUS | Status: DC
  Administered 2023-01-25 – 2023-01-26 (×2): 6 via TOPICAL

## 2023-01-25 MED ORDER — ORAL CARE MOUTH RINSE: 15.0000 mL | OROMUCOSAL | Status: DC | PRN

## 2023-01-25 NOTE — Hospital Course (Signed)
47 year old male with a history of diabetes mellitus type 2, hypertension, hyperlipidemia, anxiety/depression presenting with hyperglycemia.  The patient states that he has had polydipsia and polyuria for about a week.  He denies any fevers, chills, headache, chest pain, short of breath, cough, hemoptysis, nausea, vomiting, direct abdominal pain.  There is no hematochezia or melena.  The patient denies any recent changes in his medicines. Regarding his diabetes mellitus, the patient states that he had been on Ozempic for about a year, but has not been on it since June 2024 due to his insurance not approving it any longer.  He had continued on his metformin monotherapy 1000 mg twice daily.  He has been on metformin at the same dose for about 5 years.  There is been no adjustments to his diabetic regimen despite his loss of Ozempic.  Nevertheless, the patient had been feeling fine up until this past week when he had the above symptoms.  He checked his sugars on 01/24/2023 and his meter read high.  As result, he presented for further evaluation and treatment.  The patient states that he does not normally check his sugars.  He has never been on insulin. In the ED, the patient was afebrile and hemodynamically stable with oxygen saturation 96% on room air.  WBC 7.6, hemoglobin 16.3, platelets 272.  UA was negative for pyuria.  Sodium 119, potassium 4.1, bicarbonate 19, serum glucose 94, serum creatinine 1.49.  The patient was started on IV fluids and IV insulin.  He was admitted for further evaluation and treatment of his hyperosmolar state.

## 2023-01-25 NOTE — Progress Notes (Signed)
PROGRESS NOTE  Derek Hines QMV:[id number] DOB: [birth year] DOA: 01/24/2023 PCP: Erasmo Downer, NP  Brief History:  47 year old male with a history of diabetes mellitus type 2, hypertension, hyperlipidemia, anxiety/depression presenting with hyperglycemia.  The patient states that he has had polydipsia and polyuria for about a week.  He denies any fevers, chills, headache, chest pain, short of breath, cough, hemoptysis, nausea, vomiting, direct abdominal pain.  There is no hematochezia or melena.  The patient denies any recent changes in his medicines. Regarding his diabetes mellitus, the patient states that he had been on Ozempic for about a year, but has not been on it since June 2024 due to his insurance not approving it any longer.  He had continued on his metformin monotherapy 1000 mg twice daily.  He has been on metformin at the same dose for about 5 years.  There is been no adjustments to his diabetic regimen despite his loss of Ozempic.  Nevertheless, the patient had been feeling fine up until this past week when he had the above symptoms.  He checked his sugars on 01/24/2023 and his meter read high.  As result, he presented for further evaluation and treatment.  The patient states that he does not normally check his sugars.  He has never been on insulin. In the ED, the patient was afebrile and hemodynamically stable with oxygen saturation 96% on room air.  WBC 7.6, hemoglobin 16.3, platelets 272.  UA was negative for pyuria.  Sodium 119, potassium 4.1, bicarbonate 19, serum glucose 94, serum creatinine 1.49.  The patient was started on IV fluids and IV insulin.  He was admitted for further evaluation and treatment of his hyperosmolar state.   Assessment/Plan: Hyperosmolar nonketotic state -patient started on IV insulin with q 1 hour CBG check and q 4 hour BMPs -pt started on aggressive fluid resuscitation -Electrolytes were monitored and repleted -transitioned to [place] insulin  once anion gap closed -diet was advanced once anion gap closed -HbA1C--  AKI -baseline creatinine 0.9-1.0 -due to volume depletion -presented with serum creatinine 1.49 -continue IVF>>improving  Essential HTN -holding lisinopril and hydrochlorothiazide -monitor BP  Mixed Hyperlipidemia -continue statin  GERD -continue pantoprazole  Anxiety/Depression -continue wellbutrin and cymbalta  Hypokalemia -replete -check mag  Class 1 obesity -BMP 32.28    Family Communication:   spouse at bedside 11/17  Consultants:  none  Code Status:  FULL   DVT Prophylaxis:  [place] Heparin / [place] Lovenox   Procedures: As Listed in Progress Note Above  Antibiotics: None       Subjective: Patient denies fevers, chills, headache, chest pain, dyspnea, nausea, vomiting, diarrhea, abdominal pain, dysuria, hematuria, hematochezia, and melena.   Objective: Vitals:   01/25/23 0530 01/25/23 0600 01/25/23 0700 01/25/23 0747  BP:  127/63 104/85   Pulse: 95 91 99   Resp: 15 15 13    Temp:    97.8 F (36.6 C)  TempSrc:    Oral  SpO2: 96% 96% 95%   Weight:      Height:        Intake/Output Summary (Last 24 hours) at 01/25/2023 2841 Last data filed at 01/25/2023 0725 Gross per 24 hour  Intake 3089.65 ml  Output 500 ml  Net 2589.65 ml   Weight change:  Exam:  General:  Pt is alert, follows commands appropriately, not in acute distress HEENT: No icterus, No thrush, No neck mass, [place]/AT Cardiovascular: RRR, S1/S2, no rubs, no gallops Respiratory:  CTA bilaterally, no wheezing, no crackles, no rhonchi Abdomen: Soft/+BS, non tender, non distended, no guarding Extremities: No edema, No lymphangitis, No petechiae, No rashes, no synovitis   Data Reviewed: I have personally reviewed following labs and imaging studies Basic Metabolic Panel: Recent Labs  Lab 01/24/23 1820 01/24/23 2243 01/25/23 0247 01/25/23 0534  NA 119* 131* 133* 132*  K 4.1 3.8 3.6 3.4*  CL 84* 95* 101 101   CO2 19* 25 25 25   GLUCOSE 904* 210* 220* 168*  BUN 25* 21* 16 15  CREATININE 1.49* 1.02 0.99 0.90  CALCIUM 8.9 8.8* 8.3* 8.1*   Liver Function Tests: No results for input(s): "AST", "ALT", "ALKPHOS", "BILITOT", "PROT", "ALBUMIN" in the last 168 hours. No results for input(s): "LIPASE", "AMYLASE" in the last 168 hours. No results for input(s): "AMMONIA" in the last 168 hours. Coagulation Profile: No results for input(s): "INR", "PROTIME" in the last 168 hours. CBC: Recent Labs  Lab 01/24/23 1820  WBC 7.6  NEUTROABS 5.1  HGB 16.3  HCT 46.8  MCV 82.8  PLT 272   Cardiac Enzymes: No results for input(s): "CKTOTAL", "CKMB", "CKMBINDEX", "TROPONINI" in the last 168 hours. BNP: Invalid input(s): "POCBNP" CBG: Recent Labs  Lab 01/25/23 0348 01/25/23 0456 01/25/23 0610 01/25/23 0706 01/25/23 0806  GLUCAP 215* 192* 158* 163* 160*   HbA1C: No results for input(s): "HGBA1C" in the last 72 hours. Urine analysis:    Component Value Date/Time   COLORURINE COLORLESS (A) 01/24/2023 1820   APPEARANCEUR CLEAR 01/24/2023 1820   APPEARANCEUR Clear 10/13/2019 0858   LABSPEC 1.026 01/24/2023 1820   PHURINE 5.0 01/24/2023 1820   GLUCOSEU >=500 (A) 01/24/2023 1820   HGBUR NEGATIVE 01/24/2023 1820   BILIRUBINUR NEGATIVE 01/24/2023 1820   BILIRUBINUR Negative 10/13/2019 0858   KETONESUR NEGATIVE 01/24/2023 1820   PROTEINUR NEGATIVE 01/24/2023 1820   NITRITE NEGATIVE 01/24/2023 1820   LEUKOCYTESUR NEGATIVE 01/24/2023 1820   Sepsis Labs: @LABRCNTIP (procalcitonin:4,lacticidven:4) ) Recent Results (from the past 240 hour(s))  MRSA Next Gen by PCR, Nasal     Status: None   Collection Time: 01/24/23  7:36 PM   Specimen: Nasal Mucosa; Nasal Swab  Result Value Ref Range Status   MRSA by PCR Next Gen NOT DETECTED NOT DETECTED Final    Comment: (NOTE) The GeneXpert MRSA Assay (FDA approved for NASAL specimens only), is one component of a comprehensive MRSA colonization  surveillance program. It is not intended to diagnose MRSA infection nor to guide or monitor treatment for MRSA infections. Test performance is not FDA approved in patients less than 53 years old. Performed at [facility], [address] [place], [place] [address]      Scheduled Meds:  buPROPion  300 mg Oral q AM   Chlorhexidine Gluconate Cloth  6 each Topical Daily   DULoxetine  60 mg Oral Daily   insulin aspart  0-15 Units Subcutaneous TID WC   insulin aspart  0-5 Units Subcutaneous QHS   insulin glargine-yfgn  18 Units Subcutaneous Daily   loratadine  10 mg Oral Daily   montelukast  10 mg Oral QHS   pantoprazole  40 mg Oral q AM   pravastatin  40 mg Oral QHS   Continuous Infusions:  insulin 3.4 Units/hr (01/25/23 0725)   lactated ringers      Procedures/Studies: No results found.  Catarina Hartshorn, DO  [facility]  If 7PM-7AM, please contact night-coverage [email or web address] Password [email or web address] 01/25/2023, 9:21 AM   LOS: 1 day

## 2023-01-25 NOTE — Plan of Care (Signed)

## 2023-01-26 DIAGNOSIS — E669 Obesity, unspecified: Secondary | ICD-10-CM | POA: Diagnosis not present

## 2023-01-26 DIAGNOSIS — E871 Hypo-osmolality and hyponatremia: Secondary | ICD-10-CM | POA: Diagnosis not present

## 2023-01-26 DIAGNOSIS — E1100 Type 2 diabetes mellitus with hyperosmolarity without nonketotic hyperglycemic-hyperosmolar coma (NKHHC): Secondary | ICD-10-CM | POA: Diagnosis not present

## 2023-01-26 DIAGNOSIS — N179 Acute kidney failure, unspecified: Secondary | ICD-10-CM | POA: Diagnosis not present

## 2023-01-26 LAB — COMPREHENSIVE METABOLIC PANEL
ALT: 48 U/L — ABNORMAL HIGH (ref 0–44)
AST: 41 U/L (ref 15–41)
Albumin: 2.9 g/dL — ABNORMAL LOW (ref 3.5–5.0)
Alkaline Phosphatase: 78 U/L (ref 38–126)
Anion gap: 7 (ref 5–15)
BUN: 9 mg/dL (ref 6–20)
CO2: 23 mmol/L (ref 22–32)
Calcium: 8.3 mg/dL — ABNORMAL LOW (ref 8.9–10.3)
Chloride: 104 mmol/L (ref 98–111)
Creatinine, Ser: 0.96 mg/dL (ref 0.61–1.24)
GFR, Estimated: >60 mL/min (ref >60)
Glucose, Bld: 241 mg/dL — ABNORMAL HIGH (ref 70–99)
Potassium: 4 mmol/L (ref 3.5–5.1)
Sodium: 134 mmol/L — ABNORMAL LOW (ref 135–145)
Total Bilirubin: 0.5 mg/dL (ref <1.2)
Total Protein: 5.7 g/dL — ABNORMAL LOW (ref 6.5–8.1)

## 2023-01-26 LAB — MAGNESIUM: Magnesium: 2 mg/dL (ref 1.7–2.4)

## 2023-01-26 LAB — LIPID PANEL
Cholesterol: 138 mg/dL (ref 0–200)
HDL: 32 mg/dL — ABNORMAL LOW (ref >40)
LDL Cholesterol: 61 mg/dL (ref 0–99)
Total CHOL/HDL Ratio: 4.3 {ratio}
Triglycerides: 225 mg/dL — ABNORMAL HIGH (ref <150)
VLDL: 45 mg/dL — ABNORMAL HIGH (ref 0–40)

## 2023-01-26 LAB — GLUCOSE, CAPILLARY
Glucose-Capillary: 256 mg/dL — ABNORMAL HIGH (ref 70–99)
Glucose-Capillary: 260 mg/dL — ABNORMAL HIGH (ref 70–99)

## 2023-01-26 LAB — T4, FREE: Free T4: 1.18 ng/dL — ABNORMAL HIGH (ref 0.61–1.12)

## 2023-01-26 LAB — TSH: TSH: 1.526 u[IU]/mL (ref 0.350–4.500)

## 2023-01-26 MED ORDER — LANTUS SOLOSTAR 100 UNIT/ML ~~LOC~~ SOPN: 30.0000 [IU] | PEN_INJECTOR | Freq: Every day | SUBCUTANEOUS | 2 refills | Status: AC

## 2023-01-26 MED ORDER — INSULIN STARTER KIT- PEN NEEDLES (ENGLISH)
1.0000 | Freq: Once | Status: DC
  Filled 2023-01-26: qty 1

## 2023-01-26 MED ORDER — INSULIN STARTER KIT- PEN NEEDLES (ENGLISH): 1.0000 | Freq: Once | 0 refills | Status: AC

## 2023-01-26 MED ORDER — NOVOLOG FLEXPEN 100 UNIT/ML ~~LOC~~ SOPN: PEN_INJECTOR | SUBCUTANEOUS | 2 refills | Status: AC

## 2023-01-26 MED ORDER — LIVING WELL WITH DIABETES BOOK: Freq: Once | Status: AC

## 2023-01-26 MED ORDER — FREESTYLE LIBRE 3 SENSOR MISC: 2 refills | Status: AC

## 2023-01-26 MED ORDER — INSULIN PEN NEEDLE 31G X 4 MM MISC: 2 refills | Status: AC

## 2023-01-26 MED ORDER — INSULIN GLARGINE-YFGN 100 UNIT/ML ~~LOC~~ SOLN
30.0000 [IU] | Freq: Every day | SUBCUTANEOUS | Status: DC
  Filled 2023-01-26: qty 0.3

## 2023-01-26 MED ORDER — FREESTYLE LIBRE 3 READER DEVI: 2 refills | Status: AC

## 2023-01-26 NOTE — Discharge Summary (Signed)
Physician Discharge Summary   Patient: Derek Hines MRN: [id number] DOB: May 12, 1975  Admit date:     01/24/2023  Discharge date: 01/26/23  Discharge Physician: Onalee Hua Roya Gieselman   PCP: Erasmo Downer, NP   Recommendations at discharge:   Please follow up with primary care provider within 1-2 weeks  Please repeat BMP and CBC in one week    Hospital Course: 47 year old male with a history of diabetes mellitus type 2, hypertension, hyperlipidemia, anxiety/depression presenting with hyperglycemia.  The patient states that he has had polydipsia and polyuria for about a week.  He denies any fevers, chills, headache, chest pain, short of breath, cough, hemoptysis, nausea, vomiting, direct abdominal pain.  There is no hematochezia or melena.  The patient denies any recent changes in his medicines. Regarding his diabetes mellitus, the patient states that he had been on Ozempic for about a year, but has not been on it since June 2024 due to his insurance not approving it any longer.  He had continued on his metformin monotherapy 1000 mg twice daily.  He has been on metformin at the same dose for about 5 years.  There is been no adjustments to his diabetic regimen despite his loss of Ozempic.  Nevertheless, the patient had been feeling fine up until this past week when he had the above symptoms.  He checked his sugars on 01/24/2023 and his meter read high.  As result, he presented for further evaluation and treatment.  The patient states that he does not normally check his sugars.  He has never been on insulin. In the ED, the patient was afebrile and hemodynamically stable with oxygen saturation 96% on room air.  WBC 7.6, hemoglobin 16.3, platelets 272.  UA was negative for pyuria.  Sodium 119, potassium 4.1, bicarbonate 19, serum glucose 94, serum creatinine 1.49.  The patient was started on IV fluids and IV insulin.  He was admitted for further evaluation and treatment of his hyperosmolar  state.  Assessment and Plan: Hyperosmolar nonketotic state -patient started on IV insulin with q 1 hour CBG check and q 4 hour BMPs -pt started on aggressive fluid resuscitation -Electrolytes were monitored and repleted -transitioned to [place] insulin once anion gap closed -diet was advanced once anion gap closed -HbA1C--11.9 -d/c home with Freestyle Libre 3 system -d/c home with Lantus pen 30 units daily and Novolog flex pen sliding scale   AKI -baseline creatinine 0.9-1.0 -due to volume depletion -presented with serum creatinine 1.49 -continue IVF>>improving -serum creatinine 0.96 on day of dc   Essential HTN -holding lisinopril and hydrochlorothiazide temporarily -monitor BP -restart at time of dc   Mixed Hyperlipidemia -continue statin   GERD -continue pantoprazole   Anxiety/Depression -continue wellbutrin and cymbalta   Hypokalemia -replete -check mag   Class 1 obesity -BMP 32.28        Consultants: none Procedures performed: none  Disposition: Home Diet recommendation:  Carb modified diet DISCHARGE MEDICATION: Allergies as of 01/26/2023       Reactions   Phenergan [promethazine Hcl] Other (See Comments)   Causes hallucinations "sees frogs"   Tape Rash   "Burnt" his skin per pt        Medication List     TAKE these medications    buPROPion 300 MG 24 hr tablet Commonly known as: WELLBUTRIN XL Take 300 mg by mouth in the morning.   DULoxetine 60 MG capsule Commonly known as: CYMBALTA Take 60 mg by mouth daily.   fexofenadine 180 MG tablet Commonly  known as: ALLEGRA Take 180 mg by mouth in the morning.   Fish Oil 1000 MG Caps Take 2,000 mg by mouth at bedtime.   FreeStyle Libre 3 Reader [employer] Use to check sugars AC/HS   FreeStyle Libre 3 Sensor Misc Use to check sugars AC/HS   hydrochlorothiazide 12.5 MG capsule Commonly known as: MICROZIDE Take 12.5 mg by mouth in the morning.   Insulin Pen Needle 31G X 4 MM Misc Use with  insulin pen to dispense insulin as directed   insulin starter kit- pen needles Misc 1 kit by Other route once for 1 dose.   Lantus SoloStar 100 UNIT/ML Solostar Pen Generic drug: insulin glargine Inject 30 Units into the skin daily.   lisinopril 40 MG tablet Commonly known as: ZESTRIL Take 40 mg by mouth in the morning.   metFORMIN 500 MG 24 hr tablet Commonly known as: GLUCOPHAGE-XR Take 500 mg by mouth 2 (two) times daily.   montelukast 10 MG tablet Commonly known as: SINGULAIR Take 10 mg by mouth at bedtime.   naproxen 500 MG tablet Commonly known as: NAPROSYN Take 500 mg by mouth every 12 (twelve) hours as needed for mild pain (pain score 1-3).   NovoLOG FlexPen 100 UNIT/ML FlexPen Generic drug: insulin aspart Dispense AC Sugar 70-120- no units; 121-150--2 units; 151-200--3 units; 201-250--5 units; 251-300--8 units; 301-350--11 units; 351-400--15 units   pantoprazole 40 MG tablet Commonly known as: PROTONIX Take 40 mg by mouth in the morning.   pravastatin 40 MG tablet Commonly known as: PRAVACHOL Take 40 mg by mouth at bedtime.   triamcinolone 55 MCG/ACT Aero nasal inhaler Commonly known as: NASACORT Place 1-2 sprays into the nose daily as needed (allergies.).        Discharge Exam: Filed Weights   01/24/23 1742 01/26/23 0500  Weight: 90.7 kg 96.7 kg   HEENT:  [place]/AT, No thrush, no icterus CV:  RRR, no rub, no S3, no S4 Lung:  CTA, no wheeze, no rhonchi Abd:  soft/+BS, NT Ext:  No edema, no lymphangitis, no synovitis, no rash   Condition at discharge: stable  The results of significant diagnostics from this hospitalization (including imaging, microbiology, ancillary and laboratory) are listed below for reference.   Imaging Studies: No results found.  Microbiology: Results for orders placed or performed during the hospital encounter of 01/24/23  MRSA Next Gen by PCR, Nasal     Status: None   Collection Time: 01/24/23  7:36 PM   Specimen: Nasal  Mucosa; Nasal Swab  Result Value Ref Range Status   MRSA by PCR Next Gen NOT DETECTED NOT DETECTED Final    Comment: (NOTE) The GeneXpert MRSA Assay (FDA approved for NASAL specimens only), is one component of a comprehensive MRSA colonization surveillance program. It is not intended to diagnose MRSA infection nor to guide or monitor treatment for MRSA infections. Test performance is not FDA approved in patients less than 35 years old. Performed at [facility], [address] [place], [place] [address]     Labs: CBC: Recent Labs  Lab 01/24/23 1820  WBC 7.6  NEUTROABS 5.1  HGB 16.3  HCT 46.8  MCV 82.8  PLT 272   Basic Metabolic Panel: Recent Labs  Lab 01/24/23 1820 01/24/23 2243 01/25/23 0247 01/25/23 0534 01/26/23 0421  NA 119* 131* 133* 132* 134*  K 4.1 3.8 3.6 3.4* 4.0  CL 84* 95* 101 101 104  CO2 19* 25 25 25 23   GLUCOSE 904* 210* 220* 168* 241*  BUN  25* 21* 16 15 9   CREATININE 1.49* 1.02 0.99 0.90 0.96  CALCIUM 8.9 8.8* 8.3* 8.1* 8.3*  MG  --   --   --   --  2.0   Liver Function Tests: Recent Labs  Lab 01/26/23 0421  AST 41  ALT 48*  ALKPHOS 78  BILITOT 0.5  PROT 5.7*  ALBUMIN 2.9*   CBG: Recent Labs  Lab 01/25/23 1259 01/25/23 1547 01/25/23 1951 01/26/23 0802 01/26/23 1158  GLUCAP 197* 224* 233* 256* 260*    Discharge time spent: greater than 30 minutes.  Signed: Catarina Hartshorn, MD [facility] 01/26/2023

## 2023-01-26 NOTE — Progress Notes (Signed)
   01/26/23 0936  TOC Brief Assessment  Insurance and Status Reviewed  Patient has primary care physician Yes  Home environment has been reviewed from home  Prior level of function: independent  Prior/Current Home Services No current home services  Social Determinants of Health Reivew SDOH reviewed no interventions necessary  Readmission risk has been reviewed Yes  Transition of care needs no transition of care needs at this time    Transition of Care Department [facility]) has reviewed patient and no TOC needs have been identified at this time. We will continue to monitor patient advancement through interdisciplinary progression rounds. If new patient transition needs arise, please place a TOC consult.

## 2023-01-26 NOTE — Progress Notes (Signed)
Discharge and diabetes education completed with patient and family at bedside. Educated patient on how to administer insulin to self with insulin pen. IV removed from left forearm. No complaints or concerns during discharge process. Taken home by family.

## 2023-01-26 NOTE — Inpatient Diabetes Management (Signed)
Inpatient Diabetes Program Recommendations  AACE/ADA: New Consensus Statement on Inpatient Glycemic Control (2015)  Target Ranges:  Prepandial:   less than 140 mg/dL      Peak postprandial:   less than 180 mg/dL (1-2 hours)      Critically ill patients:  140 - 180 mg/dL   Lab Results  Component Value Date   GLUCAP 260 (H) 01/26/2023   HGBA1C 11.9 (H) 01/25/2023    Review of Glycemic Control  Diabetes history: DM2 Outpatient Diabetes medications: metformin 500 BID, previously on Ozempic x 1 year and stopped in June 2024 d/t insurance no longer approving it Current orders for Inpatient glycemic control: Semglee 30 daily, Novolog 0-15 TID with meals and 0-5 HS  HONK - IV insulin per [facility]  Inpatient Diabetes Program Recommendations:    Discharge meds:  Lantus 30 daily Novolog 0-15 TID Metformin 500 BID FS [employer] with pt on phone regarding his HgbA1C of 11.9% and starting insulin at home. Bedside RN to teach insulin pen administration. Spoke with pt about blood sugar and HgbA1C goals. Wife is RN and will be able to assist pt with insulin injections and CGM use. Discussed importance of lifestyle modification with healthy diet using portion control, exercise at least 30 min/day. Pt states he's on his feet and walks a lot at work. Encouraged to walk 20-30 min outside of work, if possible. Explained how hyperglycemia leads to damage within blood vessels which lead to the common complications seen with uncontrolled diabetes. Stressed to the patient the importance of improving glycemic control to prevent further complications from uncontrolled diabetes. Discussed hypoglycemia s/s and treatment. Discussed impact of nutrition, exercise, stress, sickness, and medications on diabetes control. Pt did not have any questions and is anxious to be discharged home. Will f/u with PCP for diabetes management.  Thank you. Ailene Ards, RD, LDN, CDCES Inpatient Diabetes  Coordinator [phone]

## 2023-01-27 LAB — C-PEPTIDE: C-Peptide: 2.3 ng/mL (ref 1.1–4.4)
